# Patient Record
Sex: Male | Born: 1935 | Race: White | Hispanic: No | Smoking: Never smoker
Health system: Southern US, Community
[De-identification: ages and names within clinical notes are randomized; demographics above are authoritative.]

## PROBLEM LIST (undated history)

## (undated) DIAGNOSIS — R259 Unspecified abnormal involuntary movements: Secondary | ICD-10-CM

## (undated) DIAGNOSIS — R5381 Other malaise: Secondary | ICD-10-CM

## (undated) DIAGNOSIS — E739 Lactose intolerance, unspecified: Secondary | ICD-10-CM

## (undated) DIAGNOSIS — R42 Dizziness and giddiness: Secondary | ICD-10-CM

## (undated) DIAGNOSIS — K8020 Calculus of gallbladder without cholecystitis without obstruction: Secondary | ICD-10-CM

## (undated) DIAGNOSIS — F329 Major depressive disorder, single episode, unspecified: Secondary | ICD-10-CM

## (undated) DIAGNOSIS — H918X9 Other specified hearing loss, unspecified ear: Secondary | ICD-10-CM

## (undated) DIAGNOSIS — R252 Cramp and spasm: Secondary | ICD-10-CM

## (undated) DIAGNOSIS — F0280 Dementia in other diseases classified elsewhere without behavioral disturbance: Secondary | ICD-10-CM

## (undated) DIAGNOSIS — E785 Hyperlipidemia, unspecified: Secondary | ICD-10-CM

## (undated) DIAGNOSIS — R7302 Impaired glucose tolerance (oral): Secondary | ICD-10-CM

## (undated) DIAGNOSIS — R160 Hepatomegaly, not elsewhere classified: Secondary | ICD-10-CM

## (undated) DIAGNOSIS — J309 Allergic rhinitis, unspecified: Secondary | ICD-10-CM

## (undated) DIAGNOSIS — G309 Alzheimer's disease, unspecified: Secondary | ICD-10-CM

## (undated) DIAGNOSIS — F0390 Unspecified dementia without behavioral disturbance: Secondary | ICD-10-CM

## (undated) DIAGNOSIS — R0602 Shortness of breath: Secondary | ICD-10-CM

## (undated) DIAGNOSIS — M549 Dorsalgia, unspecified: Secondary | ICD-10-CM

## (undated) DIAGNOSIS — R5383 Other fatigue: Secondary | ICD-10-CM

## (undated) HISTORY — DX: Shortness of breath: R06.02

## (undated) HISTORY — DX: Lactose intolerance, unspecified: E73.9

## (undated) HISTORY — DX: Other malaise: R53.81

## (undated) HISTORY — DX: Dizziness and giddiness: R42

## (undated) HISTORY — DX: Dorsalgia, unspecified: M54.9

## (undated) HISTORY — DX: Calculus of gallbladder without cholecystitis without obstruction: K80.20

## (undated) HISTORY — DX: Hyperlipidemia, unspecified: E78.5

## (undated) HISTORY — DX: Other specified hearing loss, unspecified ear: H91.8X9

## (undated) HISTORY — DX: Hepatomegaly, not elsewhere classified: R16.0

## (undated) HISTORY — DX: Major depressive disorder, single episode, unspecified: F32.9

## (undated) HISTORY — DX: Other fatigue: R53.83

## (undated) HISTORY — DX: Cramp and spasm: R25.2

## (undated) HISTORY — PX: OTHER SURGICAL HISTORY: SHX169

## (undated) HISTORY — DX: Unspecified dementia without behavioral disturbance: F03.90

## (undated) HISTORY — DX: Allergic rhinitis, unspecified: J30.9

## (undated) HISTORY — DX: Impaired glucose tolerance (oral): R73.02

## (undated) HISTORY — DX: Unspecified abnormal involuntary movements: R25.9

---

## 2005-01-22 ENCOUNTER — Ambulatory Visit: Payer: Self-pay | Admitting: Internal Medicine

## 2006-05-27 ENCOUNTER — Ambulatory Visit: Payer: Self-pay | Admitting: Internal Medicine

## 2006-05-27 LAB — CONVERTED CEMR LAB
Alkaline Phosphatase: 47 units/L (ref 39–117)
BUN: 16 mg/dL (ref 6–23)
Basophils Absolute: 0 10*3/uL (ref 0.0–0.1)
Bilirubin, Direct: 0.1 mg/dL (ref 0.0–0.3)
CO2: 32 meq/L (ref 19–32)
Cholesterol: 199 mg/dL (ref 0–200)
Creatinine, Ser: 0.9 mg/dL (ref 0.4–1.5)
Eosinophils Absolute: 0.1 10*3/uL (ref 0.0–0.6)
Eosinophils Relative: 2.6 % (ref 0.0–5.0)
GFR calc Af Amer: 108 mL/min
Glucose, Bld: 110 mg/dL — ABNORMAL HIGH (ref 70–99)
HCT: 43.5 % (ref 39.0–52.0)
HDL: 55.1 mg/dL (ref >39.0)
Hemoglobin: 15.1 g/dL (ref 13.0–17.0)
LDL Cholesterol: 130 mg/dL — ABNORMAL HIGH (ref 0–99)
Lymphocytes Relative: 23.6 % (ref 12.0–46.0)
MCHC: 34.8 g/dL (ref 30.0–36.0)
MCV: 95.5 fL (ref 78.0–100.0)
Monocytes Absolute: 0.4 10*3/uL (ref 0.2–0.7)
Neutro Abs: 2.4 10*3/uL (ref 1.4–7.7)
Neutrophils Relative %: 62 % (ref 43.0–77.0)
Nitrite: NEGATIVE
PSA: 1.33 ng/mL (ref 0.10–4.00)
Potassium: 4 meq/L (ref 3.5–5.1)
Sodium: 142 meq/L (ref 135–145)
Total Bilirubin: 1 mg/dL (ref 0.3–1.2)
Total CHOL/HDL Ratio: 3.6
Total Protein: 7.1 g/dL (ref 6.0–8.3)
Urobilinogen, UA: 0.2 (ref 0.0–1.0)
WBC: 3.8 10*3/uL — ABNORMAL LOW (ref 4.5–10.5)

## 2006-06-12 ENCOUNTER — Ambulatory Visit: Payer: Self-pay | Admitting: Gastroenterology

## 2006-06-26 ENCOUNTER — Ambulatory Visit: Payer: Self-pay | Admitting: Gastroenterology

## 2007-02-05 ENCOUNTER — Ambulatory Visit: Payer: Self-pay | Admitting: Internal Medicine

## 2007-02-05 DIAGNOSIS — M549 Dorsalgia, unspecified: Secondary | ICD-10-CM

## 2007-02-05 DIAGNOSIS — E785 Hyperlipidemia, unspecified: Secondary | ICD-10-CM | POA: Insufficient documentation

## 2007-02-05 HISTORY — DX: Dorsalgia, unspecified: M54.9

## 2007-02-05 HISTORY — DX: Hyperlipidemia, unspecified: E78.5

## 2008-06-16 ENCOUNTER — Ambulatory Visit: Payer: Self-pay | Admitting: Internal Medicine

## 2008-06-16 DIAGNOSIS — R259 Unspecified abnormal involuntary movements: Secondary | ICD-10-CM

## 2008-06-16 DIAGNOSIS — R252 Cramp and spasm: Secondary | ICD-10-CM

## 2008-06-16 HISTORY — DX: Cramp and spasm: R25.2

## 2008-06-16 HISTORY — DX: Unspecified abnormal involuntary movements: R25.9

## 2008-06-17 LAB — CONVERTED CEMR LAB
BUN: 8 mg/dL (ref 6–23)
Basophils Absolute: 0 10*3/uL (ref 0.0–0.1)
Bilirubin Urine: NEGATIVE
Cholesterol: 206 mg/dL — ABNORMAL HIGH (ref 0–200)
Creatinine, Ser: 1 mg/dL (ref 0.4–1.5)
Direct LDL: 122.1 mg/dL
GFR calc non Af Amer: 78.09 mL/min (ref >60)
Glucose, Bld: 104 mg/dL — ABNORMAL HIGH (ref 70–99)
HCT: 39.5 % (ref 39.0–52.0)
Hemoglobin, Urine: NEGATIVE
Ketones, ur: NEGATIVE mg/dL
Lymphs Abs: 1 10*3/uL (ref 0.7–4.0)
MCV: 95.4 fL (ref 78.0–100.0)
Monocytes Absolute: 0.4 10*3/uL (ref 0.1–1.0)
Monocytes Relative: 12.4 % — ABNORMAL HIGH (ref 3.0–12.0)
Neutrophils Relative %: 56.6 % (ref 43.0–77.0)
Platelets: 200 10*3/uL (ref 150.0–400.0)
Potassium: 4.7 meq/L (ref 3.5–5.1)
RDW: 11.3 % — ABNORMAL LOW (ref 11.5–14.6)
Total Bilirubin: 0.9 mg/dL (ref 0.3–1.2)
Total CHOL/HDL Ratio: 3
Total Protein, Urine: NEGATIVE mg/dL
Triglycerides: 79 mg/dL (ref 0.0–149.0)
Urine Glucose: NEGATIVE mg/dL
VLDL: 15.8 mg/dL (ref 0.0–40.0)

## 2008-10-29 ENCOUNTER — Encounter: Payer: Self-pay | Admitting: Internal Medicine

## 2008-11-02 ENCOUNTER — Encounter: Payer: Self-pay | Admitting: Internal Medicine

## 2008-11-02 ENCOUNTER — Ambulatory Visit: Payer: Self-pay | Admitting: Internal Medicine

## 2008-11-02 DIAGNOSIS — R5381 Other malaise: Secondary | ICD-10-CM | POA: Insufficient documentation

## 2008-11-02 DIAGNOSIS — E739 Lactose intolerance, unspecified: Secondary | ICD-10-CM

## 2008-11-02 DIAGNOSIS — R5383 Other fatigue: Secondary | ICD-10-CM

## 2008-11-02 DIAGNOSIS — R0602 Shortness of breath: Secondary | ICD-10-CM

## 2008-11-02 HISTORY — DX: Shortness of breath: R06.02

## 2008-11-02 HISTORY — DX: Lactose intolerance, unspecified: E73.9

## 2008-11-02 HISTORY — DX: Other malaise: R53.81

## 2008-11-03 LAB — CONVERTED CEMR LAB
ALT: 16 units/L (ref 0–53)
BUN: 9 mg/dL (ref 6–23)
Basophils Absolute: 0 10*3/uL (ref 0.0–0.1)
Basophils Relative: 0.3 % (ref 0.0–3.0)
Bilirubin, Direct: 0 mg/dL (ref 0.0–0.3)
CO2: 33 meq/L — ABNORMAL HIGH (ref 19–32)
Chloride: 102 meq/L (ref 96–112)
Creatinine, Ser: 1 mg/dL (ref 0.4–1.5)
Eosinophils Absolute: 0 10*3/uL (ref 0.0–0.7)
Hemoglobin, Urine: NEGATIVE
Leukocytes, UA: NEGATIVE
Lymphocytes Relative: 22.7 % (ref 12.0–46.0)
MCHC: 34.2 g/dL (ref 30.0–36.0)
Neutrophils Relative %: 64.4 % (ref 43.0–77.0)
Nitrite: NEGATIVE
RBC: 4.33 M/uL (ref 4.22–5.81)
TSH: 1.39 microintl units/mL (ref 0.35–5.50)
Total Protein, Urine: NEGATIVE mg/dL
Total Protein: 7.1 g/dL (ref 6.0–8.3)
Urobilinogen, UA: 0.2 (ref 0.0–1.0)

## 2008-11-09 ENCOUNTER — Ambulatory Visit: Payer: Self-pay | Admitting: Internal Medicine

## 2008-11-09 DIAGNOSIS — R160 Hepatomegaly, not elsewhere classified: Secondary | ICD-10-CM

## 2008-11-09 DIAGNOSIS — R42 Dizziness and giddiness: Secondary | ICD-10-CM

## 2008-11-09 HISTORY — DX: Dizziness and giddiness: R42

## 2008-11-09 HISTORY — DX: Hepatomegaly, not elsewhere classified: R16.0

## 2008-11-16 ENCOUNTER — Encounter: Admission: RE | Admit: 2008-11-16 | Discharge: 2008-11-16 | Payer: Self-pay | Admitting: Internal Medicine

## 2008-11-17 ENCOUNTER — Ambulatory Visit: Payer: Self-pay | Admitting: Internal Medicine

## 2008-11-17 ENCOUNTER — Encounter: Payer: Self-pay | Admitting: Internal Medicine

## 2008-11-18 ENCOUNTER — Ambulatory Visit: Payer: Self-pay | Admitting: Internal Medicine

## 2008-11-18 ENCOUNTER — Encounter: Payer: Self-pay | Admitting: Internal Medicine

## 2008-11-18 ENCOUNTER — Ambulatory Visit: Payer: Self-pay

## 2009-06-07 ENCOUNTER — Ambulatory Visit: Payer: Self-pay | Admitting: Internal Medicine

## 2009-06-07 DIAGNOSIS — J309 Allergic rhinitis, unspecified: Secondary | ICD-10-CM | POA: Insufficient documentation

## 2009-06-07 DIAGNOSIS — K8020 Calculus of gallbladder without cholecystitis without obstruction: Secondary | ICD-10-CM | POA: Insufficient documentation

## 2009-06-07 DIAGNOSIS — H918X9 Other specified hearing loss, unspecified ear: Secondary | ICD-10-CM

## 2009-06-07 HISTORY — DX: Calculus of gallbladder without cholecystitis without obstruction: K80.20

## 2009-06-07 HISTORY — DX: Allergic rhinitis, unspecified: J30.9

## 2009-06-07 HISTORY — DX: Other specified hearing loss, unspecified ear: H91.8X9

## 2009-09-01 ENCOUNTER — Encounter: Payer: Self-pay | Admitting: Internal Medicine

## 2009-09-13 ENCOUNTER — Encounter: Payer: Self-pay | Admitting: Internal Medicine

## 2010-02-16 ENCOUNTER — Encounter: Payer: Self-pay | Admitting: Internal Medicine

## 2010-03-30 NOTE — Letter (Signed)
Summary: [facility]  [facility]   Imported By: Sherian Rein 09/30/2009 09:27:39  _____________________________________________________________________  External Attachment:    Type:   Image     Comment:   External Document

## 2010-03-30 NOTE — Letter (Signed)
Summary: [facility]  [facility]   Imported By: Sherian Rein 02/23/2010 15:59:54  _____________________________________________________________________  External Attachment:    Type:   Image     Comment:   External Document

## 2010-03-30 NOTE — Letter (Signed)
Summary: [facility]  [facility]   Imported By: Sherian Rein 09/30/2009 09:28:52  _____________________________________________________________________  External Attachment:    Type:   Image     Comment:   External Document

## 2010-03-30 NOTE — Assessment & Plan Note (Signed)
Summary: daughter made appt,needs f/u appt/#/cd   Vital Signs:  Patient profile:   76 year old male Height:      69.5 inches Weight:      151.25 pounds BMI:     22.10 O2 Sat:      97 % on Room air Temp:     98.2 degrees F oral Pulse rate:   67 / minute BP sitting:   114 / 60  (left arm) Cuff size:   regular  Vitals Entered ByZella Ball Ewing (June 07, 2009 11:14 AM)  O2 Flow:  Room air  Preventive Care Screening  Last Flu Shot:    Date:  11/26/2008    Results:  given   CC: followup/RE   Primary Care Provider:  Dr. Oliver Barre  CC:  followup/RE.  History of Present Illness: here with family, overall doing well , without specific complaint, except are unhappy with what they described as unhelpful and abrupt behavior by Neurology at last exams.  Has been taking the primidone for several years, and has dementia, but at most recent exam , he was suggested to have probable parkinson's, for which it was recommended to change the primidone to sinamet 25/100 three times a day.  They were not comfortable with this, and wanted more explanation about the disease and the tx, but this did not occur.  They have not made the change as suggested, and in fact request a neurology referral to a different group, for second opinion.  No new complaints.  Pt denies CP, sob, doe, wheezing, orthopnea, pnd, worsening LE edema, palps, dizziness or syncope  Pt denies new neuro symptoms such as headache, facial or extremity weakness .  Does have mild to mod nasal allergy symptoms worse in the past 3 to 4 wks, with post nasal gtt, clear drainage, sneeze and itch, but no fever, pain or colored d/c.    Problems Prior to Update: 1)  Other Specified Forms of Hearing Loss  (ICD-389.8) 2)  Allergic Rhinitis  (ICD-477.9) 3)  Tremor  (ICD-781.0) 4)  Cholelithiasis  (ICD-574.20) 5)  Hepatomegaly  (ICD-789.1) 6)  Dizziness  (ICD-780.4) 7)  Dyspnea  (ICD-786.05) 8)  Glucose Intolerance  (ICD-271.3) 9)  Dyspnea   (ICD-786.05) 10)  Fatigue  (ICD-780.79) 11)  Preventive Health Care  (ICD-V70.0) 12)  Leg Cramps, Nocturnal  (ICD-729.82) 13)  Tremor  (ICD-781.0) 14)  Back Pain  (ICD-724.5) 15)  Hyperlipidemia  (ICD-272.4) 16)  Family History of Alcoholism/addiction  (ICD-V61.41)  Medications Prior to Update: 1)  Multivitamins   Tabs (Multiple Vitamin) .... Take 1 By Mouth Qd 2)  Adult Aspirin Ec Low Strength 81 Mg  Tbec (Aspirin) .... Take 1 By Mouth Qd 3)  Aricept 10 Mg Tabs (Donepezil Hcl) .Marland Kitchen.. 1po Once Daily 4)  Primidone 50 Mg Tabs (Primidone) .Marland Kitchen.. 1po At Bedtime 5)  Namenda 5 Mg Tabs (Memantine Hcl) .Marland Kitchen.. 1 By Mouth Every Am and 2 By Mouth Every Pm 6)  Pepcid Ac Maximum Strength 20 Mg Tabs (Famotidine) .... One At Bedtime  Current Medications (verified): 1)  Multivitamins   Tabs (Multiple Vitamin) .... Take 1 By Mouth Qd 2)  Adult Aspirin Ec Low Strength 81 Mg  Tbec (Aspirin) .... Take 1 By Mouth Qd 3)  Aricept 10 Mg Tabs (Donepezil Hcl) .Marland Kitchen.. 1po Once Daily 4)  Primidone 50 Mg Tabs (Primidone) .Marland Kitchen.. 1po At Bedtime 5)  Namenda 10 Mg Tabs (Memantine Hcl) .Marland Kitchen.. 1po Two Times A Day 6)  Pepcid Ac Maximum Strength 20  Mg Tabs (Famotidine) .... One At Bedtime  Allergies (verified): No Known Drug Allergies  Past History:  Past Surgical History: Last updated: 02/05/2007 left and right hand surgury  Social History: Last updated: 11/18/2008 Never Smoked Alcohol use-no Widowed Lives alone Retired  Risk Factors: Smoking Status: never (02/05/2007)  Past Medical History: Hyperlipidemia tremor Unexplained sob    - - PFT's 11/17/08  FEV1 3.44 (120%) ratio 88, nl dlco    -  ECHO mild ar November 18, 2008  HEPATOMEGALY (ICD-789.1) DIZZINESS (ICD-780.4) DYSPNEA (ICD-786.05) GLUCOSE INTOLERANCE (ICD-271.3) DYSPNEA (ICD-786.05) FATIGUE (ICD-780.79) PREVENTIVE HEALTH CARE (ICD-V70.0) LEG CRAMPS, NOCTURNAL (ICD-729.82) TREMOR (ICD-781.0) BACK PAIN (ICD-724.5) HYPERLIPIDEMIA  (ICD-272.4) FAMILY HISTORY OF ALCOHOLISM/ADDICTION (ICD-V61.41) cholelithiasis  Allergic rhinitis  Review of Systems       all otherwise negative per pt -  also with some decreased hearing to right > left ears without pain , fever, dizziness, or ST or cough  Physical Exam  General:  alert and well-developed.   Head:  normocephalic and atraumatic.   Eyes:  vision grossly intact, pupils equal, and pupils round.   Ears:  R ear normal and L ear normal.  after wax irrigation for impaction Nose:  nasal dischargemucosal pallor and mucosal edema.   Mouth:  pharyngeal erythema and fair dentition.   Neck:  supple and no masses.   Lungs:  normal respiratory effort and normal breath sounds.   Heart:  normal rate and regular rhythm.   Extremities:  no edema, no erythema  Neurologic:  not done in detail today   Impression & Recommendations:  Problem # 1:  TREMOR (ICD-781.0)  ? parkinson - to refer to neurology [facility] for second opinion per family request, Continue all previous medications as before this visit   Orders: Neurology Referral (Neuro)  Problem # 2:  ALLERGIC RHINITIS (ICD-477.9) for allegra as needed , consider flonase if not improved  Problem # 3:  HYPERLIPIDEMIA (ICD-272.4)  Labs Reviewed: SGOT: 23 (11/02/2008)   SGPT: 16 (11/02/2008)   HDL:62.20 (06/16/2008), 55.1 (05/27/2006)  LDL:130 (05/27/2006)  Chol:206 (06/16/2008), 199 (05/27/2006)  Trig:79.0 (06/16/2008), 68 (05/27/2006) cont diet, declines further tx at this time, d/w pt and family  Problem # 4:  OTHER SPECIFIED FORMS OF HEARING LOSS (ICD-389.8) improved, s/p irrigation  Complete Medication List: 1)  Multivitamins Tabs (Multiple vitamin) .... Take 1 by mouth qd 2)  Adult Aspirin Ec Low Strength 81 Mg Tbec (Aspirin) .... Take 1 by mouth qd 3)  Aricept 10 Mg Tabs (Donepezil hcl) .Marland Kitchen.. 1po once daily 4)  Primidone 50 Mg Tabs (Primidone) .Marland Kitchen.. 1po at bedtime 5)  Namenda 10 Mg Tabs (Memantine hcl) .Marland Kitchen.. 1po  two times a day 6)  Pepcid Ac Maximum Strength 20 Mg Tabs (Famotidine) .... One at bedtime  Patient Instructions: 1)  Your ears were irrigated today 2)  Please take all new medications as prescribed - the generic allegra 3)  Continue all previous medications as before this visit  4)  You will be contacted about the referral(s) to: Neurology 5)  Please schedule a follow-up appointment in 6 months with CPX labs

## 2010-05-23 ENCOUNTER — Ambulatory Visit (INDEPENDENT_AMBULATORY_CARE_PROVIDER_SITE_OTHER): Payer: Medicare Other | Admitting: Internal Medicine

## 2010-05-23 ENCOUNTER — Encounter: Payer: Self-pay | Admitting: Internal Medicine

## 2010-05-23 VITALS — BP 120/62 | HR 61 | Temp 97.9°F | Ht 69.0 in | Wt 156.0 lb

## 2010-05-23 DIAGNOSIS — L989 Disorder of the skin and subcutaneous tissue, unspecified: Secondary | ICD-10-CM

## 2010-05-23 DIAGNOSIS — Z0000 Encounter for general adult medical examination without abnormal findings: Secondary | ICD-10-CM

## 2010-05-23 MED ORDER — SERTRALINE HCL 50 MG PO TABS: 50.0000 mg | ORAL_TABLET | Freq: Every day | ORAL | Status: DC

## 2010-05-23 NOTE — Progress Notes (Signed)
Subjective:    Patient ID: Titus Mould, male    DOB: 1934-09-29, 76 y.o.   MRN: [id number]  HPI Here to f/u with family - Here for wellness and f/u;  Overall doing ok;  Pt hx limited due to dementia but denies CP, worsening SOB, DOE, wheezing, orthopnea, PND, worsening LE edema, palpitations, dizziness or syncope.  Pt denies neurological change such as new Headache, facial or extremity weakness.  Pt denies polydipsia, polyuria, or low sugar symptoms. Pt states overall good compliance with treatment and medications, good tolerability, and trying to follow lower cholesterol diet.  Pt denies worsening depressive symptoms, suicidal ideation or panic. No fever, wt loss, night sweats, loss of appetite, or other constitutional symptoms.  Pt states good ability with ADL's, low fall risk, home safety reviewed and adequate, no significant changes in hearing or vision, and occasionally active with exercise.  Does have a skin lesion to the crown of the head for several months, gradually enlarging , not painful or ulcerating, not assoc with trauma. No hx of skin cancer.  Past Medical History  Diagnosis Date  . GLUCOSE INTOLERANCE 11/02/2008  . HYPERLIPIDEMIA 02/05/2007  . Other specified forms of hearing loss 06/07/2009  . ALLERGIC RHINITIS 06/07/2009  . CHOLELITHIASIS 06/07/2009  . BACK PAIN 02/05/2007  . LEG CRAMPS, NOCTURNAL 06/16/2008  . DIZZINESS 11/09/2008  . FATIGUE 11/02/2008  . TREMOR 06/16/2008  . DYSPNEA 11/02/2008  . Hepatomegaly 11/09/2008   Past Surgical History  Procedure Date  . Left and right hand surgury     reports that he has never smoked. He does not have any smokeless tobacco history on file. He reports that he does not drink alcohol. His drug history not on file. family history includes Alcohol abuse in his father; Cancer in his father; and Heart disease in his mother. No Known Allergies  Review of Systems Review of Systems  Constitutional: Negative for diaphoresis, activity change,  appetite change and unexpected weight change.  HENT: Negative for hearing loss, ear pain, facial swelling, mouth sores and neck stiffness.   Eyes: Negative for pain, redness and visual disturbance.  Respiratory: Negative for shortness of breath and wheezing.   Cardiovascular: Negative for chest pain and palpitations.  Gastrointestinal: Negative for diarrhea, blood in stool, abdominal distention and rectal pain.  Genitourinary: Negative for hematuria, flank pain and decreased urine volume.  Musculoskeletal: Negative for myalgias and joint swelling.  Skin: Negative for color change and wound.  Neurological: Negative for syncope and numbness.  Hematological: Negative for adenopathy.  Psychiatric/Behavioral: Negative for hallucinations, self-injury, decreased concentration and agitation.      Objective:   Physical Exam Physical Exam  VS noted Constitutional: Pt is oriented to person, place, and time. Appears well-developed and well-nourished.  HENT:  Head: Normocephalic and atraumatic.  Right Ear: External ear normal.  Left Ear: External ear normal.  Nose: Nose normal.  Mouth/Throat: Oropharynx is clear and moist.  Eyes: Conjunctivae and EOM are normal. Pupils are equal, round, and reactive to light.  Neck: Normal range of motion. Neck supple. No JVD present. No tracheal deviation present.  Cardiovascular: Normal rate, regular rhythm, normal heart sounds and intact distal pulses.   Pulmonary/Chest: Effort normal and breath sounds normal.  Abdominal: Soft. Bowel sounds are normal. There is no tenderness.  Musculoskeletal: Normal range of motion. Exhibits no edema.  Lymphadenopathy:  Has no cervical adenopathy.  Neurological: Pt is alert but disoriented to person, place, and time. Pt has normal reflexes. No cranial nerve deficit.  Skin: Skin is warm and dry. No rash noted. Has skin lesion to crown 10 mm, somewhat raised, erythem/tan, nonulcerated, somewhat crusted Psychiatric:  Has  normal  mood and affect. Behavior is normal.         Assessment & Plan:

## 2010-05-23 NOTE — Patient Instructions (Signed)
Continue all other medications as before You will be contacted regarding the referral for: dermatology Please call if you would like lab work done

## 2010-05-23 NOTE — Assessment & Plan Note (Signed)
Overall doing well, age appropriate education and counseling updated, referrals for preventative services and immunizations addressed, dietary and smoking counseling addressed, most recent labs and ECG reviewed.  I have personally reviewed and have noted: 1) the patient's medical and social history 2) The pt's use of alcohol, tobacco, and illicit drugs 3) The patient's current medications and supplements 4) Functional ability including ADL's, fall risk, home safety risk, hearing and visual impairment 5) Diet and physical activities 6) Evidence for depression or mood disorder 7) The patient's height, weight, and BMI have been recorded in the chart I have made referrals, and provided counseling and education based on review of the above Pt and family decline lab work today, may re-consider in the future

## 2010-05-23 NOTE — Assessment & Plan Note (Signed)
Somewhat suspicious for skin ca - for derm referral

## 2010-12-15 ENCOUNTER — Other Ambulatory Visit: Payer: Self-pay | Admitting: Internal Medicine

## 2010-12-15 DIAGNOSIS — M25559 Pain in unspecified hip: Secondary | ICD-10-CM

## 2010-12-15 DIAGNOSIS — M549 Dorsalgia, unspecified: Secondary | ICD-10-CM

## 2010-12-15 NOTE — Progress Notes (Signed)
Daughter in today - father fell down stairs, pt refuses ER or visit but will do xrays

## 2011-03-06 ENCOUNTER — Ambulatory Visit (INDEPENDENT_AMBULATORY_CARE_PROVIDER_SITE_OTHER): Payer: Medicare Other

## 2011-03-06 DIAGNOSIS — Z111 Encounter for screening for respiratory tuberculosis: Secondary | ICD-10-CM

## 2011-04-13 ENCOUNTER — Ambulatory Visit (INDEPENDENT_AMBULATORY_CARE_PROVIDER_SITE_OTHER): Payer: Medicare Other | Admitting: Internal Medicine

## 2011-04-13 ENCOUNTER — Encounter: Payer: Self-pay | Admitting: Internal Medicine

## 2011-04-13 VITALS — BP 108/78 | HR 64 | Temp 98.4°F | Resp 14 | Wt 155.8 lb

## 2011-04-13 DIAGNOSIS — R7309 Other abnormal glucose: Secondary | ICD-10-CM

## 2011-04-13 DIAGNOSIS — F0390 Unspecified dementia without behavioral disturbance: Secondary | ICD-10-CM

## 2011-04-13 DIAGNOSIS — F329 Major depressive disorder, single episode, unspecified: Secondary | ICD-10-CM

## 2011-04-13 DIAGNOSIS — R21 Rash and other nonspecific skin eruption: Secondary | ICD-10-CM

## 2011-04-13 DIAGNOSIS — R7302 Impaired glucose tolerance (oral): Secondary | ICD-10-CM

## 2011-04-13 DIAGNOSIS — F3289 Other specified depressive episodes: Secondary | ICD-10-CM

## 2011-04-13 MED ORDER — HYDROXYZINE HCL 50 MG PO TABS: 50.0000 mg | ORAL_TABLET | Freq: Four times a day (QID) | ORAL | Status: AC | PRN

## 2011-04-13 MED ORDER — PERMETHRIN 5 % EX CREA: TOPICAL_CREAM | Freq: Once | CUTANEOUS | Status: AC

## 2011-04-13 NOTE — Assessment & Plan Note (Signed)
?   Scabies  - for permethrin asd -   Scabies: Topical: Apply cream from head to toe; leave on for 8-14 hours before washing off with water; for infants, also apply on the hairline, neck, scalp, temple, and forehead; may reapply in 1 week if live mites appear. Permethrin 5% cream was shown to be safe and effective when applied to an infant <1 month of age with neonatal scabies; time of application was limited to 6 hours before rinsing with soap and wate

## 2011-04-13 NOTE — Patient Instructions (Addendum)
Take all new medications as prescribed - the cream You can also use the benadryl you have on your medication list for itching Continue all other medications as before Please return in 1 month, or sooner if needed Please call if not improved, for dermatology referral

## 2011-04-14 ENCOUNTER — Encounter: Payer: Self-pay | Admitting: Internal Medicine

## 2011-04-14 DIAGNOSIS — R7302 Impaired glucose tolerance (oral): Secondary | ICD-10-CM

## 2011-04-14 DIAGNOSIS — F32A Depression, unspecified: Secondary | ICD-10-CM

## 2011-04-14 DIAGNOSIS — F0390 Unspecified dementia without behavioral disturbance: Secondary | ICD-10-CM

## 2011-04-14 DIAGNOSIS — F329 Major depressive disorder, single episode, unspecified: Secondary | ICD-10-CM | POA: Insufficient documentation

## 2011-04-14 HISTORY — DX: Impaired glucose tolerance (oral): R73.02

## 2011-04-14 HISTORY — DX: Depression, unspecified: F32.A

## 2011-04-14 HISTORY — DX: Unspecified dementia, unspecified severity, without behavioral disturbance, psychotic disturbance, mood disturbance, and anxiety: F03.90

## 2011-04-14 NOTE — Assessment & Plan Note (Signed)
stable overall by hx and exam, and pt to continue medical treatment as before 

## 2011-04-14 NOTE — Assessment & Plan Note (Signed)
stable overall by hx and exam, most recent data reviewed with pt, and pt to continue medical treatment as before Lab Results  Component Value Date   HGBA1C 5.4 11/02/2008    

## 2011-04-14 NOTE — Assessment & Plan Note (Signed)
stable overall by hx and exam, most recent data reviewed with pt, and pt to continue medical treatment as before  Lab Results  Component Value Date   WBC 4.7 11/02/2008   HGB 14.3 11/02/2008   HCT 41.8 11/02/2008   PLT 195.0 11/02/2008   GLUCOSE 78 11/02/2008   CHOL 206* 06/16/2008   TRIG 79.0 06/16/2008   HDL 62.20 06/16/2008   LDLDIRECT 122.1 06/16/2008   LDLCALC 130* 05/27/2006   ALT 16 11/02/2008   AST 23 11/02/2008   NA 141 11/02/2008   K 4.6 11/02/2008   CL 102 11/02/2008   CREATININE 1.0 11/02/2008   BUN 9 11/02/2008   CO2 33* 11/02/2008   TSH 1.39 11/02/2008   PSA 0.92 06/16/2008   HGBA1C 5.4 11/02/2008

## 2011-04-14 NOTE — Progress Notes (Signed)
Subjective:    Patient ID: Phillip Gonzales, male    DOB: 04/27/1934, 77 y.o.   MRN: [id number]  HPI  Here to f/u with acute onset rash to back starting at the beltline and extneding up the back, some crusty, mild ithing;  Facility where he lives with mult cases of scabies.  No prior hx of same. Denies worsening depressive symptoms, suicidal ideation, or panic, though has ongoing anxiety, not increased recently.  Dementia overall stable symptomatically with gradual worsening at best, and not assoc with behavioral changes such as hallucinations, paranoia, or agitation.  Pt denies chest pain, increased sob or doe, wheezing, orthopnea, PND, increased LE swelling, palpitations, dizziness or syncope.  Pt denies new neurological symptoms such as new headache, or facial or extremity weakness or numbness   Pt denies polydipsia, polyuria.    Past Medical History  Diagnosis Date  . GLUCOSE INTOLERANCE 11/02/2008  . HYPERLIPIDEMIA 02/05/2007  . Other specified forms of hearing loss 06/07/2009  . ALLERGIC RHINITIS 06/07/2009  . CHOLELITHIASIS 06/07/2009  . BACK PAIN 02/05/2007  . LEG CRAMPS, NOCTURNAL 06/16/2008  . DIZZINESS 11/09/2008  . FATIGUE 11/02/2008  . TREMOR 06/16/2008  . DYSPNEA 11/02/2008  . Hepatomegaly 11/09/2008  . Impaired glucose tolerance 04/14/2011   Past Surgical History  Procedure Date  . Left and right hand surgury     reports that he has never smoked. He does not have any smokeless tobacco history on file. He reports that he does not drink alcohol. His drug history not on file. family history includes Alcohol abuse in his father; Cancer in his father; and Heart disease in his mother. No Known Allergies Current Outpatient Prescriptions on File Prior to Visit  Medication Sig Dispense Refill  . aspirin 81 MG EC tablet Take 81 mg by mouth daily.        . diphenhydrAMINE (BENADRYL) 25 MG tablet Take 25 mg by mouth daily.        Marland Kitchen donepezil (ARICEPT) 10 MG tablet Take 10 mg by mouth daily.         . famotidine (PEPCID AC MAXIMUM STRENGTH) 20 MG tablet Take 20 mg by mouth 2 (two) times daily as needed. At bedtime       . memantine (NAMENDA) 10 MG tablet Take 10 mg by mouth 2 (two) times daily.        . Multiple Vitamin (MULTIVITAMIN) tablet Take 1 tablet by mouth daily.        . primidone (MYSOLINE) 50 MG tablet Take 50 mg by mouth at bedtime.        . sertraline (ZOLOFT) 50 MG tablet Take 1 tablet (50 mg total) by mouth daily.  30 tablet  2   Review of Systems Review of Systems  Constitutional: Negative for diaphoresis and unexpected weight change.  HENT: Negative for drooling and tinnitus.   Eyes: Negative for photophobia and visual disturbance.  Respiratory: Negative for choking and stridor.   Gastrointestinal: Negative for vomiting and blood in stool.  Genitourinary: Negative for hematuria and decreased urine volume.     Objective:   Physical Exam BP 108/78  Pulse 64  Temp(Src) 98.4 F (36.9 C) (Oral)  Resp 14  Wt 155 lb 12 oz (70.648 kg)  SpO2 98% Physical Exam  VS noted Constitutional: Pt appears well-developed and well-nourished.  HENT: Head: Normocephalic.  Right Ear: External ear normal.  Left Ear: External ear normal.  Eyes: Conjunctivae and EOM are normal. Pupils are equal, round, and reactive to light.  Neck: Normal range of motion. Neck supple.  Cardiovascular: Normal rate and regular rhythm.   Pulmonary/Chest: Effort normal and breath sounds normal.  Neurological: Pt is alert. No cranial nerve deficit.  Skin: Skin is warm. No erythema. except for diffuse rash worse at beltline but extending bilat to the t5 level as well Psychiatric: Pt behavior is normal. Thought content normal. not depressed affect or nervous    Assessment & Plan:

## 2011-05-15 ENCOUNTER — Ambulatory Visit: Payer: Medicare Other | Admitting: Internal Medicine

## 2011-05-31 ENCOUNTER — Ambulatory Visit (INDEPENDENT_AMBULATORY_CARE_PROVIDER_SITE_OTHER): Payer: Medicare Other | Admitting: Internal Medicine

## 2011-05-31 ENCOUNTER — Encounter: Payer: Self-pay | Admitting: Internal Medicine

## 2011-05-31 VITALS — BP 110/62 | HR 56 | Temp 97.6°F | Ht 65.0 in | Wt 154.5 lb

## 2011-05-31 DIAGNOSIS — R7309 Other abnormal glucose: Secondary | ICD-10-CM

## 2011-05-31 DIAGNOSIS — L989 Disorder of the skin and subcutaneous tissue, unspecified: Secondary | ICD-10-CM | POA: Insufficient documentation

## 2011-05-31 DIAGNOSIS — H1010 Acute atopic conjunctivitis, unspecified eye: Secondary | ICD-10-CM

## 2011-05-31 DIAGNOSIS — R5383 Other fatigue: Secondary | ICD-10-CM

## 2011-05-31 DIAGNOSIS — H1045 Other chronic allergic conjunctivitis: Secondary | ICD-10-CM

## 2011-05-31 DIAGNOSIS — E785 Hyperlipidemia, unspecified: Secondary | ICD-10-CM

## 2011-05-31 DIAGNOSIS — R5381 Other malaise: Secondary | ICD-10-CM

## 2011-05-31 DIAGNOSIS — R7302 Impaired glucose tolerance (oral): Secondary | ICD-10-CM

## 2011-05-31 MED ORDER — KETOTIFEN FUMARATE 0.025 % OP SOLN: 1.0000 [drp] | Freq: Two times a day (BID) | OPHTHALMIC | Status: AC

## 2011-05-31 NOTE — Patient Instructions (Addendum)
Your most recent rash to the back has resolved You will be contacted regarding the referral for: dermatology for the new skin spot to the left neck near the ear Take all new medications as prescribed - the Zaditor (prescription given, but I think is OTC) Continue all other medications as before Please go to LAB in the Basement for the blood and/or urine tests to be done today You will be contacted by phone if any changes need to be made immediately.  Otherwise, you will receive a letter about your results with an explanation. Please return in 6 months, or sooner if  needed

## 2011-05-31 NOTE — Assessment & Plan Note (Signed)
To left neck - for derm referral, s/p recent skin cancer removed from scalp jan 2013 - daughter asks for same dermatologist

## 2011-06-03 ENCOUNTER — Encounter: Payer: Self-pay | Admitting: Internal Medicine

## 2011-06-03 NOTE — Progress Notes (Signed)
Subjective:    Patient ID: Phillip Gonzales, male    DOB: Mar 13, 1934, 77 y.o.   MRN: [id number]  HPI  Here to f/u; Pt not helpful with hx., but here with wife this visit,  Rash from last visit resolved.  Did have recent skin tear, now scabbed with bandiad to post left hand, no evidence cellullitis today.  Does have eye itching apparently , worse right than left and some clearish d/c as well.   Pt denies fever, wt loss, night sweats, loss of appetite, or other constitutional symptoms .  Pt unable to give other signficant hx due to dementia, but wife states Pt denies chest pain, increased sob or doe, wheezing, orthopnea, PND, increased LE swelling, palpitations, dizziness or syncope. Pt denies new neurological symptoms such as new headache, or facial or extremity weakness or numbness . Does seem to have ongoing fatigue per wife, o/w nonspecific.  No overt bleeding or bruising.  Past Medical History  Diagnosis Date  . GLUCOSE INTOLERANCE 11/02/2008  . HYPERLIPIDEMIA 02/05/2007  . Other specified forms of hearing loss 06/07/2009  . ALLERGIC RHINITIS 06/07/2009  . CHOLELITHIASIS 06/07/2009  . BACK PAIN 02/05/2007  . LEG CRAMPS, NOCTURNAL 06/16/2008  . DIZZINESS 11/09/2008  . FATIGUE 11/02/2008  . TREMOR 06/16/2008  . DYSPNEA 11/02/2008  . Hepatomegaly 11/09/2008  . Impaired glucose tolerance 04/14/2011  . Dementia 04/14/2011  . Depression 04/14/2011   Past Surgical History  Procedure Date  . Left and right hand surgury     reports that he has never smoked. He does not have any smokeless tobacco history on file. He reports that he does not drink alcohol or use illicit drugs. family history includes Alcohol abuse in his father; Cancer in his father; and Heart disease in his mother. No Known Allergies Current Outpatient Prescriptions on File Prior to Visit  Medication Sig Dispense Refill  . aspirin 81 MG EC tablet Take 81 mg by mouth daily.        . diphenhydrAMINE (BENADRYL) 25 MG tablet Take 25 mg by  mouth daily.        Marland Kitchen donepezil (ARICEPT) 10 MG tablet Take 10 mg by mouth daily.        . famotidine (PEPCID AC MAXIMUM STRENGTH) 20 MG tablet Take 20 mg by mouth 2 (two) times daily as needed. At bedtime       . memantine (NAMENDA) 10 MG tablet Take 10 mg by mouth 2 (two) times daily.        . Multiple Vitamin (MULTIVITAMIN) tablet Take 1 tablet by mouth daily.        . primidone (MYSOLINE) 50 MG tablet Take 50 mg by mouth at bedtime.        . sertraline (ZOLOFT) 50 MG tablet Take 1 tablet (50 mg total) by mouth daily.  30 tablet  2   Review of Systems Unable due to dementia    Objective:   Physical Exam BP 110/62  Pulse 56  Temp(Src) 97.6 F (36.4 C) (Oral)  Ht 5\' 5"  (1.651 m)  Wt 154 lb 8 oz (70.081 kg)  BMI 25.71 kg/m2  SpO2 92% Physical Exam  VS noted Constitutional: Pt appears well-developed and well-nourished.  HENT: Head: Normocephalic.  Right Ear: External ear normal.  Left Ear: External ear normal.  Eyes: Bilat Conjunctivae mild erythema, no d/c and EOM are normal. Pupils are equal, round, and reactive to light.  Neck: Normal range of motion. Neck supple.  Cardiovascular: Normal rate and regular rhythm.  Pulmonary/Chest: Effort normal and breath sounds normal.  Abd:  Soft, NT, non-distended, + BS Neurological: Pt is alert. No cranial nerve deficit.  Skin: Skin is warm. No erythema. No rash, Small scab noted left hand, nontender, no cellultiis Skin lesion dark to left neck increased in size per wife Psychiatric: Pt behavior is normal. Thought content c/w severe dementia    Assessment & Plan:

## 2011-06-03 NOTE — Assessment & Plan Note (Signed)
Asympt, for a1c today,  to f/u any worsening symptoms or concerns 

## 2011-06-03 NOTE — Assessment & Plan Note (Signed)
Etiology unclear, Exam otherwise benign, to check labs as documented, follow with expectant management  

## 2011-06-03 NOTE — Assessment & Plan Note (Signed)
most recent data reviewed with pt, with mild elev LDL prior, and pt to continue medical treatment as before but consider statin for LDL > 100 if not able with diet  Lab Results  Component Value Date   LDLCALC 130* 05/27/2006

## 2011-06-03 NOTE — Assessment & Plan Note (Signed)
Ok for otc zaditor prn,  to f/u any worsening symptoms or concerns  

## 2011-11-01 ENCOUNTER — Ambulatory Visit (INDEPENDENT_AMBULATORY_CARE_PROVIDER_SITE_OTHER): Payer: Medicare Other | Admitting: Internal Medicine

## 2011-11-01 ENCOUNTER — Ambulatory Visit (INDEPENDENT_AMBULATORY_CARE_PROVIDER_SITE_OTHER)
Admission: RE | Admit: 2011-11-01 | Discharge: 2011-11-01 | Disposition: A | Discharge status: Home / Self Care | Payer: Medicare Other | Source: Ambulatory Visit | Attending: Internal Medicine | Admitting: Internal Medicine

## 2011-11-01 ENCOUNTER — Encounter: Payer: Self-pay | Admitting: Internal Medicine

## 2011-11-01 VITALS — BP 122/64 | HR 74 | Temp 97.5°F | Resp 16 | Wt 155.8 lb

## 2011-11-01 DIAGNOSIS — M79609 Pain in unspecified limb: Secondary | ICD-10-CM

## 2011-11-01 DIAGNOSIS — M79642 Pain in left hand: Secondary | ICD-10-CM | POA: Insufficient documentation

## 2011-11-01 DIAGNOSIS — Z23 Encounter for immunization: Secondary | ICD-10-CM

## 2011-11-01 DIAGNOSIS — M19049 Primary osteoarthritis, unspecified hand: Secondary | ICD-10-CM

## 2011-11-01 MED ORDER — DICLOFENAC SODIUM 1.5 % TD SOLN: 10.0000 [drp] | Freq: Three times a day (TID) | TRANSDERMAL | Status: DC

## 2011-11-01 NOTE — Addendum Note (Signed)
Addended by: Rock Nephew T on: 11/01/2011 02:51 PM   Modules accepted: Orders

## 2011-11-01 NOTE — Patient Instructions (Signed)
Osteoarthritis Osteoarthritis is the most common form of arthritis. It is redness, soreness, and swelling (inflammation) affecting the cartilage. Cartilage acts as a cushion, covering the ends of bones where they meet to form a joint. CAUSES  Over time, the cartilage begins to wear away. This causes bone to rub on bone. This produces pain and stiffness in the affected joints. Factors that contribute to this problem are:  Excessive body weight.   Age.   Overuse of joints.  SYMPTOMS   People with osteoarthritis usually experience joint pain, swelling, or stiffness.   Over time, the joint may lose its normal shape.   Small deposits of bone (osteophytes) may grow on the edges of the joint.   Bits of bone or cartilage can break off and float inside the joint space. This may cause more pain and damage.   Osteoarthritis can lead to depression, anxiety, feelings of helplessness, and limitations on daily activities.  The most commonly affected joints are in the:  Ends of the fingers.   Thumbs.   Neck.   Lower back.   Knees.   Hips.  DIAGNOSIS  Diagnosis is mostly based on your symptoms and exam. Tests may be helpful, including:  X-rays of the affected joint.   A computerized magnetic scan (MRI).   Blood tests to rule out other types of arthritis.   Joint fluid tests. This involves using a needle to draw fluid from the joint and examining the fluid under a microscope.  TREATMENT  Goals of treatment are to control pain, improve joint function, maintain a normal body weight, and maintain a healthy lifestyle. Treatment approaches may include:  A prescribed exercise program with rest and joint relief.   Weight control with nutritional education.   Pain relief techniques such as:   Properly applied heat and cold.   Electric pulses delivered to nerve endings under the skin (transcutaneous electrical nerve stimulation, TENS).   Massage.   Certain supplements. Ask your  caregiver before using any supplements, especially in combination with prescribed drugs.   Medicines to control pain, such as:   Acetaminophen.   Nonsteroidal anti-inflammatory drugs (NSAIDs), such as naproxen.   Narcotic or central-acting agents, such as tramadol. This drug carries a risk of addiction and is generally prescribed for short-term use.   Corticosteroids. These can be given orally or as injection. This is a short-term treatment, not recommended for routine use.   Surgery to reposition the bones and relieve pain (osteotomy) or to remove loose pieces of bone and cartilage. Joint replacement may be needed in advanced states of osteoarthritis.  HOME CARE INSTRUCTIONS  Your caregiver can recommend specific types of exercise. These may include:  Strengthening exercises. These are done to strengthen the muscles that support joints affected by arthritis. They can be performed with weights or with exercise bands to add resistance.   Aerobic activities. These are exercises, such as brisk walking or low-impact aerobics, that get your heart pumping. They can help keep your lungs and circulatory system in shape.   Range-of-motion activities. These keep your joints limber.   Balance and agility exercises. These help you maintain daily living skills.  Learning about your condition and being actively involved in your care will help improve the course of your osteoarthritis. SEEK MEDICAL CARE IF:   You feel hot or your skin turns red.   You develop a rash in addition to your joint pain.   You have an oral temperature above 102 F (38.9 C).  FOR   MORE INFORMATION  National Institute of Arthritis and Musculoskeletal and Skin Diseases: www.niams.nih.gov National Institute on Aging: www.nia.nih.gov American College of Rheumatology: www.rheumatology.org Document Released: 02/12/2005 Document Revised: 02/01/2011 Document Reviewed: 05/26/2009 [employer] Patient Information 2012 [employer]. 

## 2011-11-01 NOTE — Progress Notes (Signed)
  Subjective:    Patient ID: Phillip Gonzales, male    DOB: 06-02-1934, 77 y.o.   MRN: [id number]  Arthritis Presents for initial visit. The disease course has been worsening. The condition has lasted for 1 year. He complains of pain. He reports no stiffness, joint swelling or joint warmth. Affected locations include the left MCP and left PIP. His pain is at a severity of 2/10. Pertinent negatives include no diarrhea, fatigue, pain at night, pain while resting, rash or weight loss. His past medical history is significant for osteoarthritis. His pertinent risk factors include overuse. Past treatments include nothing.      Review of Systems  Constitutional: Negative.  Negative for weight loss and fatigue.  HENT: Negative.   Eyes: Negative.   Respiratory: Negative.   Cardiovascular: Negative.   Gastrointestinal: Negative.  Negative for diarrhea.  Genitourinary: Negative.   Musculoskeletal: Positive for arthralgias and arthritis. Negative for myalgias, back pain, joint swelling, gait problem and stiffness.  Skin: Negative.  Negative for rash.  Neurological: Positive for tremors.  Hematological: Negative.   Psychiatric/Behavioral: Negative.        Objective:   Physical Exam  Musculoskeletal:       Left hand: He exhibits bony tenderness (MCP and IP joints are hypertrophied c/w OA). He exhibits normal range of motion, no tenderness, normal capillary refill, no deformity, no laceration and no swelling. normal sensation noted. Normal strength noted.      Lab Results  Component Value Date   WBC 4.7 11/02/2008   HGB 14.3 11/02/2008   HCT 41.8 11/02/2008   PLT 195.0 11/02/2008   GLUCOSE 78 11/02/2008   CHOL 206* 06/16/2008   TRIG 79.0 06/16/2008   HDL 62.20 06/16/2008   LDLDIRECT 122.1 06/16/2008   LDLCALC 130* 05/27/2006   ALT 16 11/02/2008   AST 23 11/02/2008   NA 141 11/02/2008   K 4.6 11/02/2008   CL 102 11/02/2008   CREATININE 1.0 11/02/2008   BUN 9 11/02/2008   CO2 33* 11/02/2008   TSH 1.39 11/02/2008     PSA 0.92 06/16/2008   HGBA1C 5.4 11/02/2008      Assessment & Plan:

## 2011-11-01 NOTE — Assessment & Plan Note (Signed)
He will start using Pennsaid for symptom relief

## 2011-11-01 NOTE — Assessment & Plan Note (Signed)
I will check a plain film of the left hand to look for spurs, djd, joint destruction, etc.

## 2011-12-13 ENCOUNTER — Ambulatory Visit (INDEPENDENT_AMBULATORY_CARE_PROVIDER_SITE_OTHER): Payer: Medicare Other | Admitting: Internal Medicine

## 2011-12-13 ENCOUNTER — Encounter: Payer: Self-pay | Admitting: Internal Medicine

## 2011-12-13 VITALS — BP 112/70 | HR 75 | Temp 97.2°F | Ht 69.0 in | Wt 156.2 lb

## 2011-12-13 DIAGNOSIS — Z0000 Encounter for general adult medical examination without abnormal findings: Secondary | ICD-10-CM

## 2011-12-13 DIAGNOSIS — R7309 Other abnormal glucose: Secondary | ICD-10-CM

## 2011-12-13 DIAGNOSIS — R7302 Impaired glucose tolerance (oral): Secondary | ICD-10-CM

## 2011-12-13 NOTE — Progress Notes (Signed)
Subjective:    Patient ID: Phillip Gonzales, male    DOB: 1934-05-30, 77 y.o.   MRN: [id number]  HPI  Here for wellness and f/u with several family members including his daughter POA;  Overall doing ok;  Pt with dementia but denies CP, worsening SOB, DOE, wheezing, orthopnea, PND, worsening LE edema, palpitations, dizziness or syncope.  Pt denies neurological change such as new Headache, facial or extremity weakness.  Pt denies polydipsia, polyuria, or low sugar symptoms. Pt states overall good compliance with treatment and medications, good tolerability, and trying to follow lower cholesterol diet.  Pt denies worsening depressive symptoms, suicidal ideation or panic. No fever, wt loss, night sweats, loss of appetite, or other constitutional symptoms.  Pt states good ability with ADL's, low to mod fall risk, lives at senior living - safety reviewed and adequate, no significant changes in hearing or vision except for right hearing loss for about 1 wk, and not very active with exercise due to dementia and and tremor. Past Medical History  Diagnosis Date  . GLUCOSE INTOLERANCE 11/02/2008  . HYPERLIPIDEMIA 02/05/2007  . Other specified forms of hearing loss 06/07/2009  . ALLERGIC RHINITIS 06/07/2009  . CHOLELITHIASIS 06/07/2009  . BACK PAIN 02/05/2007  . LEG CRAMPS, NOCTURNAL 06/16/2008  . DIZZINESS 11/09/2008  . FATIGUE 11/02/2008  . TREMOR 06/16/2008  . DYSPNEA 11/02/2008  . Hepatomegaly 11/09/2008  . Impaired glucose tolerance 04/14/2011  . Dementia 04/14/2011  . Depression 04/14/2011   Past Surgical History  Procedure Date  . Left and right hand surgury     reports that he has never smoked. He does not have any smokeless tobacco history on file. He reports that he does not drink alcohol or use illicit drugs. family history includes Alcohol abuse in his father; Cancer in his father; and Heart disease in his mother. No Known Allergies Current Outpatient Prescriptions on File Prior to Visit  Medication  Sig Dispense Refill  . aspirin 81 MG EC tablet Take 81 mg by mouth daily.        . Diclofenac Sodium (PENNSAID) 1.5 % SOLN Place 0.5 mLs onto the skin 3 (three) times daily. Rub onto painful joints of left hand and left fingers  150 mL  11  . diphenhydrAMINE (BENADRYL) 25 MG tablet Take 25 mg by mouth daily.        Marland Kitchen donepezil (ARICEPT) 10 MG tablet Take 10 mg by mouth daily.        . famotidine (PEPCID AC MAXIMUM STRENGTH) 20 MG tablet Take 20 mg by mouth 2 (two) times daily as needed. At bedtime       . memantine (NAMENDA) 10 MG tablet Take 10 mg by mouth 2 (two) times daily.        . Multiple Vitamin (MULTIVITAMIN) tablet Take 1 tablet by mouth daily.        . primidone (MYSOLINE) 50 MG tablet Take 50 mg by mouth at bedtime.        . sertraline (ZOLOFT) 50 MG tablet Take 50 mg by mouth daily.       Review of Systems Review of Systems  Constitutional: Negative for diaphoresis, activity change, appetite change and unexpected weight change.  - in fact wt increased 2 lbs with better diet recent HENT: Negative for ear pain, facial swelling, mouth sores and neck stiffness.   Eyes: Negative for pain, redness and visual disturbance.  Respiratory: Negative for shortness of breath and wheezing.   Cardiovascular: Negative for chest pain and  palpitations.  Gastrointestinal: Negative for diarrhea, blood in stool, abdominal distention and rectal pain.  Genitourinary: Negative for hematuria, flank pain and decreased urine volume.  Musculoskeletal: Negative for myalgias and joint swelling.  Skin: Negative for color change and wound.  Neurological: Negative for syncope and numbness.  Hematological: Negative for adenopathy.    Objective:   Physical Exam BP 112/70  Pulse 75  Temp 97.2 F (36.2 C) (Oral)  Ht 5\' 9"  (1.753 m)  Wt 156 lb 4 oz (70.875 kg)  BMI 23.07 kg/m2  SpO2 97% Physical Exam  VS noted Constitutional: Pt is oriented to person, place, and time. Appears well-developed and  well-nourished.  HENT:  Head: Normocephalic and atraumatic.  Right Ear: External ear normal.  Left Ear: External ear normal.  Nose: Nose normal.  Mouth/Throat: Oropharynx is clear and moist.  Eyes: Conjunctivae and EOM are normal. Pupils are equal, round, and reactive to light.  Neck: Normal range of motion. Neck supple. No JVD present. No tracheal deviation present.  Cardiovascular: Normal rate, regular rhythm, normal heart sounds and intact distal pulses.   Pulmonary/Chest: Effort normal and breath sounds normal.  Abdominal: Soft. Bowel sounds are normal. There is no tenderness.  Musculoskeletal: Normal range of motion. Exhibits no edema.  Lymphadenopathy:  Has no cervical adenopathy.  Neurological: Pt is alert and oriented to person, place, and time. Pt has normal reflexes. No cranial nerve deficit. + tremor and shaky with anxiety Skin: Skin is warm and dry. No rash noted.  Psychiatric: 1+ nervous    Assessment & Plan:

## 2011-12-13 NOTE — Assessment & Plan Note (Signed)

## 2011-12-13 NOTE — Assessment & Plan Note (Signed)
stable overall by hx and exam, most recent data reviewed with pt, and pt to continue medical treatment as before Lab Results  Component Value Date   HGBA1C 5.4 11/02/2008

## 2011-12-13 NOTE — Patient Instructions (Addendum)
OK for finger food diet, and continue with OT  Please keep your appointments with your specialists as you have planned - neurology as you do Please go to LAB in the [facility] for the blood and/or urine tests to be done today You will be contacted by phone if any changes need to be made immediately.  Otherwise, you will receive a letter about your results with an explanation, but please check Mychart first. Thank you for enrolling in MyChart. Please follow the instructions below to securely access your online medical record. MyChart allows you to send messages to your doctor, view your test results, renew your prescriptions, schedule appointments, and more. You are given the note about your flu shot today,and the note about the diet Please return in 6 months, or sooner if needed

## 2012-03-05 ENCOUNTER — Encounter: Payer: Self-pay | Admitting: Internal Medicine

## 2012-03-05 ENCOUNTER — Ambulatory Visit (INDEPENDENT_AMBULATORY_CARE_PROVIDER_SITE_OTHER): Payer: Medicare Other | Admitting: Internal Medicine

## 2012-03-05 VITALS — BP 118/80 | HR 67 | Temp 97.4°F | Ht 69.0 in | Wt 160.0 lb

## 2012-03-05 DIAGNOSIS — R066 Hiccough: Secondary | ICD-10-CM

## 2012-03-05 MED ORDER — BACLOFEN 10 MG PO TABS: 10.0000 mg | ORAL_TABLET | Freq: Three times a day (TID) | ORAL | Status: DC

## 2012-03-05 NOTE — Progress Notes (Signed)
Subjective:    Patient ID: Phillip Gonzales, male    DOB: 03-Jan-1935, 78 y.o.   MRN: [id number]  HPI  Pt presents to the clinic today with c/o hiccups x 3 days. He has tried mechanical maneuvers such as holding his breath and drinking cold water but nothing is helping. He was awake all night last night with the hiccups. He has had them intermittently before but never any that lasted this long.  Review of Systems      Past Medical History  Diagnosis Date  . GLUCOSE INTOLERANCE 11/02/2008  . HYPERLIPIDEMIA 02/05/2007  . Other specified forms of hearing loss 06/07/2009  . ALLERGIC RHINITIS 06/07/2009  . CHOLELITHIASIS 06/07/2009  . BACK PAIN 02/05/2007  . LEG CRAMPS, NOCTURNAL 06/16/2008  . DIZZINESS 11/09/2008  . FATIGUE 11/02/2008  . TREMOR 06/16/2008  . DYSPNEA 11/02/2008  . Hepatomegaly 11/09/2008  . Impaired glucose tolerance 04/14/2011  . Dementia 04/14/2011  . Depression 04/14/2011    Current Outpatient Prescriptions  Medication Sig Dispense Refill  . aspirin 81 MG EC tablet Take 81 mg by mouth daily.        . Diclofenac Sodium (PENNSAID) 1.5 % SOLN Place 0.5 mLs onto the skin 3 (three) times daily. Rub onto painful joints of left hand and left fingers  150 mL  11  . diphenhydrAMINE (BENADRYL) 25 MG tablet Take 25 mg by mouth daily.        Marland Kitchen donepezil (ARICEPT) 10 MG tablet Take 10 mg by mouth daily.        . famotidine (PEPCID AC MAXIMUM STRENGTH) 20 MG tablet Take 20 mg by mouth 2 (two) times daily as needed. At bedtime       . memantine (NAMENDA) 10 MG tablet Take 10 mg by mouth 2 (two) times daily.        . Multiple Vitamin (MULTIVITAMIN) tablet Take 1 tablet by mouth daily.        . primidone (MYSOLINE) 50 MG tablet Take 50 mg by mouth at bedtime.        . sertraline (ZOLOFT) 50 MG tablet Take 50 mg by mouth daily.        No Known Allergies  Family History  Problem Relation Age of Onset  . Heart disease Mother   . Alcohol abuse Father     Alcoholism/Addiction  . Cancer  Father     Throat cancer (was a smoker)    History   Social History  . Marital Status: Widowed    Spouse Name: N/A    Number of Children: N/A  . Years of Education: N/A   Occupational History  . Not on file.   Social History Main Topics  . Smoking status: Never Smoker   . Smokeless tobacco: Not on file  . Alcohol Use: No  . Drug Use: No  . Sexually Active: Not Currently   Other Topics Concern  . Not on file   Social History Narrative  . No narrative on file     Constitutional: Denies fever, malaise, fatigue, headache or abrupt weight changes.  Respiratory: Denies difficulty breathing, shortness of breath, cough or sputum production.   Cardiovascular: Denies chest pain, chest tightness, palpitations or swelling in the hands or feet.  Gastrointestinal: Pt reports hiccups. Denies abdominal pain, bloating, constipation, diarrhea or blood in the stool.    No other specific complaints in a complete review of systems (except as listed in HPI above).  Objective:   Physical Exam   BP 118/80  Pulse 67  Temp 97.4 F (36.3 C) (Oral)  Ht 5\' 9"  (1.753 m)  Wt 160 lb (72.576 kg)  BMI 23.63 kg/m2  SpO2 97% Wt Readings from Last 3 Encounters:  03/05/12 160 lb (72.576 kg)  12/13/11 156 lb 4 oz (70.875 kg)  11/01/11 155 lb 12 oz (70.648 kg)    General: Appears his stated age, well developed, well nourished in NAD. Cardiovascular: Normal rate and rhythm. S1,S2 noted.  No murmur, rubs or gallops noted. No JVD or BLE edema. No carotid bruits noted. Pulmonary/Chest: Normal effort and positive vesicular breath sounds. No respiratory distress. No wheezes, rales or ronchi noted.  Abdomen: Constant hiccups. Soft and nontender. Normal bowel sounds, no bruits noted. No distention or masses noted. Liver, spleen and kidneys non palpable.      Assessment & Plan:   Hiccups, new onset with additional workup required:  Will start Baclofen 10 mg TID prn Information given to pt about  hiccups  RTC as needed or if symptoms persist

## 2012-03-05 NOTE — Patient Instructions (Signed)
Hiccups Hiccups are caused by a sudden contraction of the muscles between the ribs and the muscle under your lungs (diaphragm). When you hiccup, the top of your windpipe (glottis) closes immediately after your diaphragm contracts. This makes the typical 'hic' sound. A hiccup is a reflex that you cannot stop. Unlike other reflexes such as coughing and sneezing, hiccups do not seem to have any useful purpose. There are 3 types of hiccups:    Benign bouts: last less than 48 hours.   Persistent: last more than 48 hours but less than 1 month.   Intractable: last more than 1 month.  CAUSES   Most people have bouts of hiccups from time to time. They start for no apparent reason, last a short while, and then stop. Sometimes they are due to:  A temporary swollen stomach caused by overeating or eating too fast, eating spicy foods, drinking fizzy drinks, or swallowing air.   A sudden change in temperature (very hot or cold foods or drinks, a cold shower).   Drinking alcohol or using tobacco.  There are no particular tests used to diagnose hiccups. Hiccups are usually considered harmless and do not point to a serious medical condition. However, there can be underlying medical problems that may cause hiccups, such as pneumonia, diabetes, metabolic problems, tumors, abdominal infections or injuries, and neurologic problems. You must follow up with your caregiver if your symptoms persist or become a frequent problem. TREATMENT    Most cases need no treatment. A bout of benign hiccups usually does not last long.   Medicine is sometimes needed to stop persistent hiccups. Medicine may be given intravenously (IV) or by mouth.   Hypnosis or acupuncture may be suggested.   Surgery to affect the nerve that supplies the diaphragm may be tried in severe cases.   Treatment of an underlying cause is needed in some cases.  HOME CARE INSTRUCTIONS   Popular remedies that may stop a bout of hiccups  include:  Gargling ice water.   Swallowing granulated sugar.   Biting on a lemon.   Holding your breath, breathing fast, or breathing into a paper bag.   Bearing down.   Gasping after a sudden fright.   Pulling your tongue gently.   Distraction.  SEEK MEDICAL CARE IF:    Hiccups last for more than 48 hours.   You are given medicine but your hiccups do not get better.   New symptoms show up.   You cannot sleep or eat due to the hiccups.   You have unexpected weight loss.   You have trouble breathing or swallowing.   You develop severe pain in your abdomen or other areas.   You develop numbness, tingling, or weakness.  Document Released: 04/23/2001 Document Revised: 05/07/2011 Document Reviewed: 04/05/2010 [facility] Patient Information 2013 [place], [place].

## 2012-03-07 ENCOUNTER — Encounter (HOSPITAL_COMMUNITY): Payer: Self-pay | Admitting: Emergency Medicine

## 2012-03-07 ENCOUNTER — Emergency Department (HOSPITAL_COMMUNITY): Payer: Medicare Other

## 2012-03-07 ENCOUNTER — Ambulatory Visit: Payer: Medicare Other | Admitting: Internal Medicine

## 2012-03-07 ENCOUNTER — Emergency Department (HOSPITAL_COMMUNITY)
Admission: EM | Admit: 2012-03-07 | Discharge: 2012-03-07 | Disposition: A | Discharge status: Home / Self Care | Payer: Medicare Other | Attending: Emergency Medicine | Admitting: Emergency Medicine

## 2012-03-07 DIAGNOSIS — K8020 Calculus of gallbladder without cholecystitis without obstruction: Secondary | ICD-10-CM | POA: Insufficient documentation

## 2012-03-07 DIAGNOSIS — E739 Lactose intolerance, unspecified: Secondary | ICD-10-CM | POA: Insufficient documentation

## 2012-03-07 DIAGNOSIS — H918X9 Other specified hearing loss, unspecified ear: Secondary | ICD-10-CM | POA: Insufficient documentation

## 2012-03-07 DIAGNOSIS — R4789 Other speech disturbances: Secondary | ICD-10-CM | POA: Insufficient documentation

## 2012-03-07 DIAGNOSIS — R259 Unspecified abnormal involuntary movements: Secondary | ICD-10-CM | POA: Insufficient documentation

## 2012-03-07 DIAGNOSIS — R4182 Altered mental status, unspecified: Secondary | ICD-10-CM | POA: Insufficient documentation

## 2012-03-07 DIAGNOSIS — R252 Cramp and spasm: Secondary | ICD-10-CM | POA: Insufficient documentation

## 2012-03-07 DIAGNOSIS — M549 Dorsalgia, unspecified: Secondary | ICD-10-CM | POA: Insufficient documentation

## 2012-03-07 DIAGNOSIS — F3289 Other specified depressive episodes: Secondary | ICD-10-CM | POA: Insufficient documentation

## 2012-03-07 DIAGNOSIS — F0280 Dementia in other diseases classified elsewhere without behavioral disturbance: Secondary | ICD-10-CM | POA: Insufficient documentation

## 2012-03-07 DIAGNOSIS — E785 Hyperlipidemia, unspecified: Secondary | ICD-10-CM | POA: Insufficient documentation

## 2012-03-07 DIAGNOSIS — J309 Allergic rhinitis, unspecified: Secondary | ICD-10-CM | POA: Insufficient documentation

## 2012-03-07 DIAGNOSIS — Z7982 Long term (current) use of aspirin: Secondary | ICD-10-CM | POA: Insufficient documentation

## 2012-03-07 DIAGNOSIS — Z79899 Other long term (current) drug therapy: Secondary | ICD-10-CM | POA: Insufficient documentation

## 2012-03-07 DIAGNOSIS — G309 Alzheimer's disease, unspecified: Secondary | ICD-10-CM | POA: Insufficient documentation

## 2012-03-07 DIAGNOSIS — F329 Major depressive disorder, single episode, unspecified: Secondary | ICD-10-CM | POA: Insufficient documentation

## 2012-03-07 HISTORY — DX: Alzheimer's disease, unspecified: G30.9

## 2012-03-07 HISTORY — DX: Dementia in other diseases classified elsewhere, unspecified severity, without behavioral disturbance, psychotic disturbance, mood disturbance, and anxiety: F02.80

## 2012-03-07 LAB — COMPREHENSIVE METABOLIC PANEL
Alkaline Phosphatase: 56 U/L (ref 39–117)
BUN: 20 mg/dL (ref 6–23)
CO2: 28 mEq/L (ref 19–32)
GFR calc Af Amer: 72 mL/min — ABNORMAL LOW (ref >90)
GFR calc non Af Amer: 62 mL/min — ABNORMAL LOW (ref >90)
Glucose, Bld: 137 mg/dL — ABNORMAL HIGH (ref 70–99)
Potassium: 3.6 mEq/L (ref 3.5–5.1)
Total Protein: 6.7 g/dL (ref 6.0–8.3)

## 2012-03-07 LAB — CBC
HCT: 38 % — ABNORMAL LOW (ref 39.0–52.0)
Hemoglobin: 12.7 g/dL — ABNORMAL LOW (ref 13.0–17.0)
MCH: 31.4 pg (ref 26.0–34.0)
MCHC: 33.4 g/dL (ref 30.0–36.0)
RBC: 4.05 MIL/uL — ABNORMAL LOW (ref 4.22–5.81)

## 2012-03-07 LAB — URINALYSIS, ROUTINE W REFLEX MICROSCOPIC
Bilirubin Urine: NEGATIVE
Ketones, ur: NEGATIVE mg/dL
Leukocytes, UA: NEGATIVE
Nitrite: NEGATIVE
Protein, ur: NEGATIVE mg/dL
Urobilinogen, UA: 0.2 mg/dL (ref 0.0–1.0)
pH: 5.5 (ref 5.0–8.0)

## 2012-03-07 MED ORDER — FENTANYL CITRATE 0.05 MG/ML IJ SOLN
50.0000 ug | Freq: Once | INTRAMUSCULAR | Status: AC
  Administered 2012-03-07: 50 ug via INTRAVENOUS
  Filled 2012-03-07: qty 2

## 2012-03-07 MED ORDER — LORAZEPAM 1 MG PO TABS
1.0000 mg | ORAL_TABLET | Freq: Once | ORAL | Status: AC
  Administered 2012-03-07: 1 mg via ORAL
  Filled 2012-03-07: qty 1

## 2012-03-07 NOTE — Progress Notes (Signed)
CSW met with pt daughters along with pt edp. Pt with history or alzheimer's. Per discussion with EDP pt dementia may be worsening and had concerns regarding pt continued care at [facility]. CSW spoke with Tresa Endo at [facility] and assured CSW that patient needs can and will be met. [facility] is able to provide all levels of care at ALF with assistance with all ADLS and dementia care needs. CSW discussed with EDP.   Pt plans to return to [facility] when medically stable.   Catha Gosselin, LCSWA  [phone] .03/07/2012 1835pm

## 2012-03-07 NOTE — ED Notes (Signed)
In and Out did not produce any urine

## 2012-03-07 NOTE — ED Provider Notes (Signed)
History     CSN: [id number]  Arrival date & time 03/07/12  1440   First MD Initiated Contact with Patient 03/07/12 1501      Chief Complaint  Patient presents with  . Altered Mental Status    (Consider location/radiation/quality/duration/timing/severity/associated sxs/prior treatment) HPI Comments: RITA PROM is a 78 y.o. male w a hx of Dementia & Alzheimers that presents to the ER from his assisted living facility [facility]) for having altered mental status. Pts daughters report that at baseline he is communities and able to ambulate without difficulty. Since yesterday he has appeared to be in a more "foggy state, staring off into space" and has had new muscle spasms. The pt normally suffers from mils tremors, but the spasticity is new. Also new is temporary difficulty speaking. Unable to obtain more information as pt is a level V caveat.    Past Medical History  Diagnosis Date  . GLUCOSE INTOLERANCE 11/02/2008  . HYPERLIPIDEMIA 02/05/2007  . Other specified forms of hearing loss 06/07/2009  . ALLERGIC RHINITIS 06/07/2009  . CHOLELITHIASIS 06/07/2009  . BACK PAIN 02/05/2007  . LEG CRAMPS, NOCTURNAL 06/16/2008  . DIZZINESS 11/09/2008  . FATIGUE 11/02/2008  . TREMOR 06/16/2008  . DYSPNEA 11/02/2008  . Hepatomegaly 11/09/2008  . Impaired glucose tolerance 04/14/2011  . Dementia 04/14/2011  . Depression 04/14/2011  . Alzheimer disease     Past Surgical History  Procedure Date  . Left and right hand surgury     Family History  Problem Relation Age of Onset  . Heart disease Mother   . Alcohol abuse Father     Alcoholism/Addiction  . Cancer Father     Throat cancer (was a smoker)    History  Substance Use Topics  . Smoking status: Never Smoker   . Smokeless tobacco: Not on file  . Alcohol Use: No      Review of Systems  Unable to perform ROS   Allergies  Review of patient's allergies indicates no known allergies.  Home Medications   Current Outpatient Rx   Name  Route  Sig  Dispense  Refill  . ALCAFTADINE 0.25 % OP SOLN   Both Eyes   Place 1 drop into both eyes daily as needed. For allergies.         . ASPIRIN 81 MG PO TBEC   Oral   Take 81 mg by mouth daily.           Marland Kitchen BACLOFEN 10 MG PO TABS   Oral   Take 1 tablet (10 mg total) by mouth 3 (three) times daily.   30 each   0   . CAPSAICIN 0.025 % EX CREA   Topical   Apply 1 application topically 3 (three) times daily as needed. For pain.         Marland Kitchen DIPHENHYDRAMINE HCL 25 MG PO TABS   Oral   Take 25 mg by mouth at bedtime.          . DONEPEZIL HCL 10 MG PO TABS   Oral   Take 10 mg by mouth at bedtime.          Marland Kitchen FAMOTIDINE 20 MG PO TABS   Oral   Take 20 mg by mouth 2 (two) times daily as needed. For acid reflux.         Marland Kitchen HYDROXYZINE HCL 50 MG PO TABS   Oral   Take 50 mg by mouth every 6 (six) hours as needed. For itching.         Marland Kitchen  KETOTIFEN FUMARATE 0.025 % OP SOLN   Both Eyes   Place 1 drop into both eyes 2 (two) times daily.         Marland Kitchen MEMANTINE HCL 10 MG PO TABS   Oral   Take 10 mg by mouth 2 (two) times daily.           . ADULT MULTIVITAMIN W/MINERALS CH   Oral   Take 1 tablet by mouth daily.         Marland Kitchen PRIMIDONE 50 MG PO TABS   Oral   Take 100 mg by mouth at bedtime.          . PSYLLIUM 58.6 % PO POWD   Oral   Take 1 packet by mouth daily as needed. For constipation.         . SERTRALINE HCL 100 MG PO TABS   Oral   Take 100 mg by mouth daily.           BP 107/56  Pulse 65  Temp 98.1 F (36.7 C) (Oral)  Resp 18  SpO2 100%  Physical Exam  Nursing note and vitals reviewed. Constitutional: He appears well-developed and well-nourished. No distress.  HENT:  Head: Normocephalic and atraumatic.  Eyes: Conjunctivae normal and EOM are normal. Pupils are equal, round, and reactive to light. No scleral icterus.  Neck: Normal range of motion. Neck supple.  Cardiovascular: Normal rate, regular rhythm, normal heart sounds and  intact distal pulses.   Pulmonary/Chest: Effort normal. No respiratory distress. He has no wheezes. He has no rales. He exhibits no tenderness.  Abdominal: Soft. There is no tenderness.  Musculoskeletal: He exhibits no edema and no tenderness.  Neurological: He is alert. He displays a negative Romberg sign.        No facial paralysis, intermittent dysarthria, patient is alert and oriented to self. Unable to asses cranial nerves d/t pts inability to follow commands. Strength 5/5 bilaterally. Unable to assess sensation.    Skin: Skin is warm and dry. No petechiae, no purpura and no rash noted. He is not diaphoretic.  Psychiatric: His speech is not slurred. Cognition and memory are impaired.    ED Course  Procedures (including critical care time)  Labs Reviewed  CBC - Abnormal; Notable for the following:    RBC 4.05 (*)     Hemoglobin 12.7 (*)     HCT 38.0 (*)     All other components within normal limits  COMPREHENSIVE METABOLIC PANEL - Abnormal; Notable for the following:    Glucose, Bld 137 (*)     Total Bilirubin 0.2 (*)     GFR calc non Af Amer 62 (*)     GFR calc Af Amer 72 (*)     All other components within normal limits  URINALYSIS, ROUTINE W REFLEX MICROSCOPIC  GLUCOSE, CAPILLARY  URINE MICROSCOPIC-ADD ON   Ct Head Wo Contrast (only If Suspected Head Trauma And/or Pt Is On Anticoagulant)  03/07/2012  *RADIOLOGY REPORT*  Clinical Data: Brief period of loss of cognition earlier today, now with increased confusion and tremors.  Current history of Alzheimer's dementia.  CT HEAD WITHOUT CONTRAST  Technique:  Contiguous axial images were obtained from the base of the skull through the vertex without contrast.  Comparison: None.  Findings: Geographic low attenuation involving the left cerebellar hemisphere and the right cerebellar peduncle.  Moderate to severe cortical and deep atrophy and severe changes of small vessel disease of the white matter diffusely.  Focal lacunar strokes  in  the cerebellar vermis and in the brainstem.  No mass lesion.  No midline shift.  No acute hemorrhage or hematoma.  No extra-axial fluid collections.  No skull fracture or other focal osseous abnormality involving the skull.  Fluid and scattered inferior bilateral mastoid air cells. Middle ear cavities well-aerated bilaterally.  Small mucous retention cysts or polyps in the left maxillary sinus and mild mucosal thickening involving the right sphenoid sinus.  Remaining visualized paranasal sinuses well-aerated. Bony nasal septal deviation to the right.  IMPRESSION:  1.  Age indeterminate non-hemorrhagic left cerebellar hemispheric stroke and possible non-hemorrhagic stroke in the right cerebellar peduncle. 2.  Moderate to severe generalized atrophy and chronic microvascular ischemic changes of the white matter.  Lacunar strokes in the cerebellar vermis and the brainstem. 3.  Chronic right sphenoid and left maxillary sinusitis.  Minimal bilateral mastoid effusions.  These results were called by telephone on 03/07/2012 at 1615 hours to [facility], [place], of the emergency department, who verbally acknowledged these results.   Original Report Authenticated By: Hulan Saas, M.D.    Mr Brain Wo Contrast  03/07/2012  *RADIOLOGY REPORT*  Clinical Data: 78 year old male with altered mental status, tremor, confusion.  MRI HEAD WITHOUT CONTRAST  Technique:  Multiplanar, multiecho pulse sequences of the brain and surrounding structures were obtained according to standard protocol without intravenous contrast.  Comparison: Head CT without contrast 03/07/2012.  Findings: No restricted diffusion to suggest acute infarction.  The hypodensity suspected on the comparison CT is felt to have been artifact from the skull base streak.  There are small chronic lacunar infarcts in both cerebellar hemispheres (series 5 image 7).  No acute cerebellar signal abnormality or edema.  Disproportionate peri-sylvian volume loss.  Possibly some  right temporal lobe / opercular encephalomalacia. There is also biparietal volume loss.  Ex vacuo changes to the ventricles. Nonspecific periventricular white matter T2 and FLAIR hyperintensity, not felt related to transependymal edema.  Major intracranial vascular flow voids are preserved.  Negative pituitary, cervicomedullary junction and visualized cervical spine. Normal bone marrow signal. No acute intracranial hemorrhage identified.  Evidence of a small chronic micro hemorrhage in the left occipital lobe on series 7 image 13.  Possible chronic micro hemorrhage in the left cerebellum on image five.  Postoperative changes to the globes.  Mild sphenoid sinus mucosal thickening.  Other Visualized paranasal sinuses and mastoids are clear.  Negative scalp soft tissues.  IMPRESSION: 1. No acute intracranial abnormality. 2.  Chronic small vessel ischemia, most pronounced in the cerebellar hemispheres. 3.  Disproportionate temporal and parietal lobe volume loss as can be seen in Alzheimer's type dementia.  Suspect ex vacuo changes to the lateral ventricles.   Original Report Authenticated By: Erskine Speed, M.D.      No diagnosis found.    MDM  Alt mental status  Phillip Gonzales is a 78 y.o. male w a hx of Alzheimer presented to ER with mental status change. Pt evaluated for presentation of alt mental status with likely etiology of worsening dementia. [facility] where he has excellent home care and it is felt he can be dc with PCP follow up. No acute abnormalities found on MRI and labs found to be unremarkable. Pt seen and discussed with attending Dr. Juleen China who agrees with disposition plan.  At this time there does not appear to be any evidence of an acute emergency medical condition and the patient appears stable for discharge with appropriate outpatient follow up.Diagnosis was  discussed with patient who verbalizes understanding and is agreeable to discharge          Jaci Carrel,  [place] 03/07/12 2107

## 2012-03-07 NOTE — ED Notes (Signed)
Below normal temp reported to Dr. Silverio Lay

## 2012-03-07 NOTE — Discharge Instructions (Signed)
Altered Mental Status  Altered mental status most often refers to an abnormal change in your responsiveness and awareness. It can affect your speech, thought, mobility, memory, attention span, or alertness. It can range from slight confusion to complete unresponsiveness (coma). Altered mental status can be a sign of a serious underlying medical condition. Rapid evaluation and medical treatment is necessary for patients having an altered mental status.  CAUSES    Low blood sugar (hypoglycemia) or diabetes.   Severe loss of body fluids (dehydration) or a body salt (electrolyte) imbalance.   A stroke or other neurologic problem, such as dementia or delirium.   A head injury or tumor.   A drug or alcohol overdose.   Exposure to toxins or poisons.   Depression, anxiety, and stress.   A low oxygen level (hypoxia).   An infection.   Blood loss.   Twitching or shaking (seizure).   Heart problems, such as heart attack or heart rhythm problems (arrhythmias).   A body temperature that is too low or too high (hypothermia or hyperthermia).  DIAGNOSIS   A diagnosis is based on your history, symptoms, physical and neurologic examinations, and diagnostic tests. Diagnostic tests may include:   Measurement of your blood pressure, pulse, breathing, and oxygen levels (vital signs).   Blood tests.   Urine tests.   X-ray exams.   A computerized magnetic scan (magnetic resonance imaging, MRI).   A computerized X-ray scan (computed tomography, CT scan).  TREATMENT   Treatment will depend on the cause. Treatment may include:   Management of an underlying medical or mental health condition.   Critical care or support in the hospital.  HOME CARE INSTRUCTIONS    Only take over-the-counter or prescription medicines for pain, discomfort, or fever as directed by your caregiver.   Manage underlying conditions as directed by your caregiver.   Eat a healthy, well-balanced diet to maintain strength.   Join a support group or  prevention program to cope with the condition or trauma that caused the altered mental status. Ask your caregiver to help choose a program that works for you.   Follow up with your caregiver for further examination, therapy, or testing as directed.  SEEK MEDICAL CARE IF:    You feel unwell or have chills.   You or your family notice a change in your behavior or your alertness.   You have trouble following your caregiver's treatment plan.   You have questions or concerns.  SEEK IMMEDIATE MEDICAL CARE IF:    You have a rapid heartbeat or have chest pain.   You have difficulty breathing.   You have a fever.   You have a headache with a stiff neck.   You cough up blood.   You have blood in your urine or stool.   You have severe agitation or confusion.  MAKE SURE YOU:    Understand these instructions.   Will watch your condition.   Will get help right away if you are not doing well or get worse.  Document Released: 08/02/2009 Document Revised: 05/07/2011 Document Reviewed: 08/02/2009  ExitCare Patient Information 2013 [employer].

## 2012-03-07 NOTE — ED Notes (Signed)
[id number]<JY> Expected date:<BR> Expected time:<BR> Means of arrival:<BR> Comments:<BR> ems/aloc

## 2012-03-07 NOTE — ED Notes (Addendum)
Per EMS: From [facility], family states pt has brief period loss of cognitive abilities "staring off into space". Increased confusion and tremors.

## 2012-03-07 NOTE — ED Notes (Signed)
Family states that tremors have gotten worse. Pt has brief periods where he will just stare into space but pt was not having these episodes yesterday.

## 2012-03-07 NOTE — ED Notes (Signed)
Patient transported to MRI 

## 2012-03-07 NOTE — ED Notes (Signed)
Patient transported to CT 

## 2012-03-08 ENCOUNTER — Encounter: Payer: Self-pay | Admitting: Family Medicine

## 2012-03-08 ENCOUNTER — Ambulatory Visit (INDEPENDENT_AMBULATORY_CARE_PROVIDER_SITE_OTHER): Payer: Medicare Other | Admitting: Family Medicine

## 2012-03-08 VITALS — BP 120/70 | HR 65 | Temp 97.5°F | Resp 16

## 2012-03-08 DIAGNOSIS — R404 Transient alteration of awareness: Secondary | ICD-10-CM

## 2012-03-08 DIAGNOSIS — R400 Somnolence: Secondary | ICD-10-CM | POA: Insufficient documentation

## 2012-03-08 NOTE — Patient Instructions (Addendum)
Stop the baclofen  Hold the Benadryl, Atarax, Zoloft,  Call Dr. Jonny Ruiz on Tuesday and he will advise you how to restart the above 3 medications.  If his symptoms get worse bring him to the hospital for admission

## 2012-03-08 NOTE — Progress Notes (Signed)
  Subjective:    Patient ID: Phillip Gonzales, male    DOB: 1934/11/08, 78 y.o.   MRN: [id number]  HPI Phillip Gonzales is a 78 year old male who has underlying dementia. He is a resident at [facility]. He was seen here last Wednesday because of hiccups. He was given baclofen 3 times daily. After 6 doses of the medication he became very somnolent and noncommunicative. He was taken to [facility] long emerge from had a complete workup including MRI CT scans etc. etc. I felt that his symptoms were related to the baclofen however they did not stop his medication. They were told that his primary care physician needed to stop the medication   the 2 daughters bring him in today very frustrated because he's continuing to get the medication with the above symptoms.    Review of Systems    review of systems otherwise negative Objective:   Physical Exam Well-developed well-nourished male very somnolent quarter inch cut midforehead from previous fall yesterday       Assessment & Plan:  CNS side effects from baclofen plan stop baclofen hold other medications followup with PCP on Tuesday

## 2012-03-10 ENCOUNTER — Telehealth: Payer: Self-pay

## 2012-03-10 ENCOUNTER — Telehealth: Payer: Self-pay | Admitting: Internal Medicine

## 2012-03-10 NOTE — Telephone Encounter (Signed)
[facility] informed of MD instructions to restart Zoloft only.

## 2012-03-10 NOTE — Telephone Encounter (Signed)
Ok to re-start zoloft only

## 2012-03-10 NOTE — Telephone Encounter (Signed)
Daughter is calling regarding fax sent over from [facility] to restart Zoloft today.  Pt was taken off medication on 03/08/12.  Per EP[facility], Dr Jonny Ruiz had already given the ok to restart Zoloft only.  RN informed daughter that Joselyn Arrow was made aware also.  Daughter has no other questions at this time.

## 2012-03-10 NOTE — Telephone Encounter (Signed)
The family would like an ok to restart Zoloft today.  Call back number to [facility] is [phone]

## 2012-03-11 NOTE — ED Provider Notes (Signed)
Medical screening examination/treatment/procedure(s) were conducted as a shared visit with non-physician practitioner(s) and myself.  I personally evaluated the patient during the encounter.  Patient's workup was unrevealing and he is appropriate for discharge.  Gilda Crease, MD 03/11/12 1407

## 2012-03-31 ENCOUNTER — Other Ambulatory Visit: Payer: Self-pay

## 2012-03-31 MED ORDER — KETOTIFEN FUMARATE 0.025 % OP SOLN: 1.0000 [drp] | Freq: Two times a day (BID) | OPHTHALMIC | Status: DC

## 2012-05-26 ENCOUNTER — Telehealth: Payer: Self-pay

## 2012-05-26 MED ORDER — KETOTIFEN FUMARATE 0.025 % OP SOLN: 1.0000 [drp] | Freq: Two times a day (BID) | OPHTHALMIC | Status: DC

## 2012-05-26 NOTE — Telephone Encounter (Signed)
Refilled medication

## 2013-01-01 ENCOUNTER — Other Ambulatory Visit: Payer: Self-pay

## 2013-01-16 ENCOUNTER — Emergency Department (HOSPITAL_COMMUNITY): Payer: Medicare Other

## 2013-01-16 ENCOUNTER — Emergency Department (HOSPITAL_COMMUNITY)
Admission: EM | Admit: 2013-01-16 | Discharge: 2013-01-16 | Disposition: A | Discharge status: Home / Self Care | Payer: Medicare Other | Attending: Emergency Medicine | Admitting: Emergency Medicine

## 2013-01-16 ENCOUNTER — Encounter (HOSPITAL_COMMUNITY): Payer: Self-pay | Admitting: Emergency Medicine

## 2013-01-16 DIAGNOSIS — W1809XA Striking against other object with subsequent fall, initial encounter: Secondary | ICD-10-CM | POA: Insufficient documentation

## 2013-01-16 DIAGNOSIS — Z862 Personal history of diseases of the blood and blood-forming organs and certain disorders involving the immune mechanism: Secondary | ICD-10-CM | POA: Insufficient documentation

## 2013-01-16 DIAGNOSIS — F3289 Other specified depressive episodes: Secondary | ICD-10-CM | POA: Insufficient documentation

## 2013-01-16 DIAGNOSIS — Z8709 Personal history of other diseases of the respiratory system: Secondary | ICD-10-CM | POA: Insufficient documentation

## 2013-01-16 DIAGNOSIS — Z79899 Other long term (current) drug therapy: Secondary | ICD-10-CM | POA: Insufficient documentation

## 2013-01-16 DIAGNOSIS — R259 Unspecified abnormal involuntary movements: Secondary | ICD-10-CM | POA: Insufficient documentation

## 2013-01-16 DIAGNOSIS — G309 Alzheimer's disease, unspecified: Secondary | ICD-10-CM | POA: Insufficient documentation

## 2013-01-16 DIAGNOSIS — Y9389 Activity, other specified: Secondary | ICD-10-CM | POA: Insufficient documentation

## 2013-01-16 DIAGNOSIS — F0280 Dementia in other diseases classified elsewhere without behavioral disturbance: Secondary | ICD-10-CM | POA: Insufficient documentation

## 2013-01-16 DIAGNOSIS — Z8719 Personal history of other diseases of the digestive system: Secondary | ICD-10-CM | POA: Insufficient documentation

## 2013-01-16 DIAGNOSIS — H9190 Unspecified hearing loss, unspecified ear: Secondary | ICD-10-CM | POA: Insufficient documentation

## 2013-01-16 DIAGNOSIS — S0990XA Unspecified injury of head, initial encounter: Secondary | ICD-10-CM | POA: Insufficient documentation

## 2013-01-16 DIAGNOSIS — W19XXXA Unspecified fall, initial encounter: Secondary | ICD-10-CM

## 2013-01-16 DIAGNOSIS — Y9210 Unspecified residential institution as the place of occurrence of the external cause: Secondary | ICD-10-CM | POA: Insufficient documentation

## 2013-01-16 DIAGNOSIS — F329 Major depressive disorder, single episode, unspecified: Secondary | ICD-10-CM | POA: Insufficient documentation

## 2013-01-16 DIAGNOSIS — Z8639 Personal history of other endocrine, nutritional and metabolic disease: Secondary | ICD-10-CM | POA: Insufficient documentation

## 2013-01-16 NOTE — Discharge Instructions (Signed)
Head Injury, Adult °You have had a head injury that does not appear serious at this time. A concussion is a state of changed mental ability, usually from a blow to the head. You should take clear liquids for the rest of the day and then resume your regular diet. You should not take sedatives or alcoholic beverages for as long as directed by your caregiver after discharge. After injuries such as yours, most problems occur within the first 24 hours. °SYMPTOMS °These minor symptoms may be experienced after discharge: °· Memory difficulties. °· Dizziness. °· Headaches. °· Double vision. °· Hearing difficulties. °· Depression. °· Tiredness. °· Weakness. °· Difficulty with concentration. °If you experience any of these problems, you should not be alarmed. A concussion requires a few days for recovery. Many patients with head injuries frequently experience such symptoms. Usually, these problems disappear without medical care. If symptoms last for more than one day, notify your caregiver. See your caregiver sooner if symptoms are becoming worse rather than better. °HOME CARE INSTRUCTIONS  °· During the next 24 hours you must stay with someone who can watch you for the warning signs listed below. °Although it is unlikely that serious side effects will occur, you should be aware of signs and symptoms which may necessitate your return to this location. Side effects may occur up to 7  10 days following the injury. It is important for you to carefully monitor your condition and contact your caregiver or seek immediate medical attention if there is a change in your condition. °SEEK IMMEDIATE MEDICAL CARE IF:  °· There is confusion or drowsiness. °· You can not awaken the injured person. °· There is nausea (feeling sick to your stomach) or continued, forceful vomiting. °· You notice dizziness or unsteadiness which is getting worse, or inability to walk. °· You have convulsions or unconsciousness. °· You experience severe,  persistent headaches not relieved by over-the-counter or prescription medicines for pain. (Do not take aspirin as this impairs clotting abilities). Take other pain medications only as directed. °· You can not use arms or legs normally. °· There is clear or bloody discharge from the nose or ears. °MAKE SURE YOU:  °· Understand these instructions. °· Will watch your condition. °· Will get help right away if you are not doing well or get worse. °Document Released: 02/12/2005 Document Revised: 05/07/2011 Document Reviewed: 12/31/2008 °ExitCare® Patient Information ©2014 [employer]. ° °

## 2013-01-16 NOTE — ED Notes (Signed)
Pt daughter, who is healthcare POA, declined blood work.  RN is aware.

## 2013-01-16 NOTE — ED Provider Notes (Signed)
CSN: [id number]     Arrival date & time 01/16/13  1155 History   First MD Initiated Contact with Patient 01/16/13 1202     Chief Complaint  Patient presents with  . Fall   Level 5 caveat, dementia poor memory  HPI Pt resides at a nursing home.  He walked into a different room and was found on the floor with a broken table and injury to the back of the head.  Pt has severe dementia and would not normally be able to remember what happened.  Pt denies any complaints right now.  No neck pain  Or headache.   His family states the patient has severe dementia. They decided to have him stop his dementia medications about a month ago. The patient has continued to progress rather quickly. He is more unstable than he used to be in his memory is worsening. The family suspects that he walked into someone else's room thinking that it was his own and most likely tried sitting down in a spot normally where he would have a chair. Past Medical History  Diagnosis Date  . GLUCOSE INTOLERANCE 11/02/2008  . HYPERLIPIDEMIA 02/05/2007  . Other specified forms of hearing loss 06/07/2009  . ALLERGIC RHINITIS 06/07/2009  . CHOLELITHIASIS 06/07/2009  . BACK PAIN 02/05/2007  . LEG CRAMPS, NOCTURNAL 06/16/2008  . DIZZINESS 11/09/2008  . FATIGUE 11/02/2008  . TREMOR 06/16/2008  . DYSPNEA 11/02/2008  . Hepatomegaly 11/09/2008  . Impaired glucose tolerance 04/14/2011  . Dementia 04/14/2011  . Depression 04/14/2011  . Alzheimer disease    Past Surgical History  Procedure Laterality Date  . Left and right hand surgury     Family History  Problem Relation Age of Onset  . Heart disease Mother   . Alcohol abuse Father     Alcoholism/Addiction  . Cancer Father     Throat cancer (was a smoker)   History  Substance Use Topics  . Smoking status: Never Smoker   . Smokeless tobacco: Not on file  . Alcohol Use: No    Review of Systems  Constitutional: Negative for fever.  Respiratory: Negative for shortness of breath.    Cardiovascular: Negative for chest pain.  Gastrointestinal: Negative for abdominal pain.  Genitourinary: Negative for dysuria.  Neurological: Negative for headaches.  All other systems reviewed and are negative.    Allergies  Review of patient's allergies indicates no known allergies.  Home Medications   Current Outpatient Rx  Name  Route  Sig  Dispense  Refill  . acetaminophen (TYLENOL) 325 MG tablet   Oral   Take 650 mg by mouth every 4 (four) hours as needed for mild pain.         . capsicum oleoresin (TRIXAICIN) 0.025 % cream   Topical   Apply 1 application topically 3 (three) times daily.         . cetirizine (ZYRTEC) 10 MG tablet   Oral   Take 10 mg by mouth at bedtime.         . primidone (MYSOLINE) 50 MG tablet   Oral   Take 100 mg by mouth at bedtime.          . sertraline (ZOLOFT) 100 MG tablet   Oral   Take 100 mg by mouth daily.         . famotidine (PEPCID AC MAXIMUM STRENGTH) 20 MG tablet   Oral   Take 20 mg by mouth 2 (two) times daily as needed. For acid reflux.         Marland Kitchen  psyllium (METAMUCIL) 58.6 % powder   Oral   Take 1 packet by mouth daily as needed. For constipation.          BP 123/67  Pulse 67  Temp(Src) 97.5 F (36.4 C) (Oral)  Resp 16  SpO2 99% Physical Exam  Nursing note and vitals reviewed. Constitutional: No distress.  Elderly, frail  HENT:  Head: Normocephalic and atraumatic.  Right Ear: External ear normal.  Left Ear: External ear normal.  Small approximately 1 cm superficial abrasion/skin tear posterior occiput  Eyes: Conjunctivae are normal. Right eye exhibits no discharge. Left eye exhibits no discharge. No scleral icterus.  Neck: Neck supple. No tracheal deviation present.  No cervical spinal tenderness  Cardiovascular: Normal rate, regular rhythm and intact distal pulses.   Pulmonary/Chest: Effort normal and breath sounds normal. No stridor. No respiratory distress. He has no wheezes. He has no rales.   Abdominal: Soft. Bowel sounds are normal. He exhibits no distension. There is no tenderness. There is no rebound and no guarding.  Musculoskeletal: He exhibits no edema and no tenderness.  Patient is able to move all extremities, no areas of tenderness, no tenderness to palpation thoracic and lumbar spine  Neurological: He is alert. He is disoriented. He displays tremor. No sensory deficit. Cranial nerve deficit: No facial droop noted, extraocular movements are intact, hearing and vision are intact. He exhibits normal muscle tone. He displays no seizure activity. Coordination abnormal. GCS eye subscore is 4. GCS verbal subscore is 4. GCS motor subscore is 6.  General weakness although patient is able to sit up in the bed with some assistance, he is able to and involve extremities, the patient does follow commands,  Skin: Skin is warm and dry. No rash noted.  Psychiatric: He has a normal mood and affect.    ED Course  Procedures (including critical care time) Labs Review Labs Reviewed - No data to display Imaging Review Ct Head Wo Contrast  01/16/2013   CLINICAL DATA:  Patient fell and has laceration to the back of the head.  EXAM: CT HEAD WITHOUT CONTRAST  TECHNIQUE: Contiguous axial images were obtained from the base of the skull through the vertex without intravenous contrast.  COMPARISON:  MRI and head CT, 03/07/2012  FINDINGS: Ventricles are normal in configuration. There is ventricular and sulcal enlargement reflecting moderate to advanced atrophy. This is stable.  No parenchymal masses or mass effect. Patchy white matter hypoattenuation is noted consistent with moderate chronic microvascular ischemic change. No evidence of a recent transcortical infarct.  No extra-axial masses or abnormal fluid collections.  No intracranial hemorrhage.  No skull fracture. Chronic mucosal thickening in the right sphenoid sinus. Clear mastoid air cells.  IMPRESSION: 1. No acute intracranial abnormalities. 2.  Atrophy and chronic microvascular ischemic change, stable from the prior studies.   Electronically Signed   By: Amie Portland M.D.   On: 01/16/2013 13:15     MDM   1. Fall, initial encounter    Family decided they didn't want any blood testing for her EKG testing.  This was after his head CT however he been performed. The patient's dementia is rather severe in the family has indicated that they are discontinuing some of his active treatment.  Overall I suspect the patient did have a mechanical fall. THe family understands that I can not exclude any other etiologies such as a fainting spell related to a heart condition.  They are comfortable with him going back to his facility.  Celene Kras, MD 01/16/13 [phone]

## 2013-01-16 NOTE — ED Notes (Signed)
Per EMS, pt came from [facility] nursing home. Pt fell and hit the back of his head on a bedside table, breaking the table. Pt has a skin tear on the back of his head, 1.5cm in length. Staff state patient did not lose consciousness. Pt denies pain and is at baseline mental status. Pt has a hx of dementia. Pt is not on blood thinners.

## 2013-01-16 NOTE — ED Notes (Signed)
Bed: WA09 Expected date:  Expected time:  Means of arrival:  Comments: fall 

## 2013-01-16 NOTE — ED Notes (Addendum)
Notified EDP regarding healthcare POA request to not have blood work and EKG completed.

## 2013-12-13 ENCOUNTER — Encounter (HOSPITAL_COMMUNITY): Payer: Self-pay | Admitting: Emergency Medicine

## 2013-12-13 ENCOUNTER — Emergency Department (HOSPITAL_COMMUNITY)
Admission: EM | Admit: 2013-12-13 | Discharge: 2013-12-13 | Disposition: A | Discharge status: Home / Self Care | Payer: Medicare Other | Attending: Emergency Medicine | Admitting: Emergency Medicine

## 2013-12-13 ENCOUNTER — Telehealth (HOSPITAL_BASED_OUTPATIENT_CLINIC_OR_DEPARTMENT_OTHER): Payer: Self-pay

## 2013-12-13 DIAGNOSIS — R627 Adult failure to thrive: Secondary | ICD-10-CM | POA: Diagnosis not present

## 2013-12-13 DIAGNOSIS — Z8719 Personal history of other diseases of the digestive system: Secondary | ICD-10-CM | POA: Diagnosis not present

## 2013-12-13 DIAGNOSIS — G309 Alzheimer's disease, unspecified: Secondary | ICD-10-CM | POA: Diagnosis not present

## 2013-12-13 DIAGNOSIS — F0281 Dementia in other diseases classified elsewhere with behavioral disturbance: Secondary | ICD-10-CM | POA: Insufficient documentation

## 2013-12-13 DIAGNOSIS — M25559 Pain in unspecified hip: Secondary | ICD-10-CM | POA: Diagnosis present

## 2013-12-13 DIAGNOSIS — Z8639 Personal history of other endocrine, nutritional and metabolic disease: Secondary | ICD-10-CM | POA: Diagnosis not present

## 2013-12-13 DIAGNOSIS — Z79899 Other long term (current) drug therapy: Secondary | ICD-10-CM | POA: Diagnosis not present

## 2013-12-13 DIAGNOSIS — R451 Restlessness and agitation: Secondary | ICD-10-CM

## 2013-12-13 DIAGNOSIS — F0391 Unspecified dementia with behavioral disturbance: Secondary | ICD-10-CM

## 2013-12-13 DIAGNOSIS — F329 Major depressive disorder, single episode, unspecified: Secondary | ICD-10-CM | POA: Insufficient documentation

## 2013-12-13 MED ORDER — MORPHINE SULFATE 4 MG/ML IJ SOLN
4.0000 mg | Freq: Once | INTRAMUSCULAR | Status: AC
  Administered 2013-12-13: 4 mg via INTRAMUSCULAR
  Filled 2013-12-13: qty 1

## 2013-12-13 MED ORDER — MORPHINE SULFATE (CONCENTRATE) 20 MG/ML PO SOLN: 10.0000 mg | ORAL | Status: AC | PRN

## 2013-12-13 MED ORDER — LORAZEPAM 2 MG/ML PO CONC: 1.0000 mg | Freq: Four times a day (QID) | ORAL | Status: AC | PRN

## 2013-12-13 MED ORDER — LORAZEPAM 2 MG/ML IJ SOLN
1.0000 mg | Freq: Once | INTRAMUSCULAR | Status: AC
  Administered 2013-12-13: 1 mg via INTRAMUSCULAR
  Filled 2013-12-13: qty 1

## 2013-12-13 NOTE — ED Notes (Signed)
Patient's family reports two episodes of rigid, tonic/clonic activity, states different from usual tantrum.  Activity not observed by this RN or other staff. No hx of epilepsy, contusion noted to high forehead. MD notified.

## 2013-12-13 NOTE — ED Notes (Addendum)
Pt with advanced alzheimer's, c/o pain of undetermined origin.  Daughter states xrays negative for hips and thighs.  Pt non-verbal, moans with movement. Was ambulatory until yesterday.

## 2013-12-13 NOTE — ED Provider Notes (Addendum)
CSN: [id number]     Arrival date & time 12/13/13  1217 History   First MD Initiated Contact with Patient 12/13/13 1230     Chief Complaint  Patient presents with  . Hip Pain     (Consider location/radiation/quality/duration/timing/severity/associated sxs/prior Treatment) Patient is a 79 y.o. male presenting with hip pain. The history is provided by the patient and a relative. The history is limited by the condition of the patient.  Hip Pain  pt with hx advanced dementia, presents from ecf w family who indicates they want him to be given pain medication and get back in hospice care. They have noticed in past week a 'turn for worse', whereby patient with periods where he appears increasingly agitated and uncomfortable or in pain. They deny any recent trauma or fall. In past day, increased difficulty feeding, and pt spitting out his normal pain medication. They state they had been with hospice care for several months, but were 'dropped' a month ago due to patients weight and overall condition seeming to stabilize. They indicate yesterday they tried to re-establish with hospice so that their father could be kept comfortable, and receive other pain medication, but were unable to do so.  No fevers. No trauma/fall. No other recent change in medication.  Level 5 caveat - pt w severe dementia, unresponsive.     Past Medical History  Diagnosis Date  . GLUCOSE INTOLERANCE 11/02/2008  . HYPERLIPIDEMIA 02/05/2007  . Other specified forms of hearing loss 06/07/2009  . ALLERGIC RHINITIS 06/07/2009  . CHOLELITHIASIS 06/07/2009  . BACK PAIN 02/05/2007  . LEG CRAMPS, NOCTURNAL 06/16/2008  . DIZZINESS 11/09/2008  . FATIGUE 11/02/2008  . TREMOR 06/16/2008  . DYSPNEA 11/02/2008  . Hepatomegaly 11/09/2008  . Impaired glucose tolerance 04/14/2011  . Dementia 04/14/2011  . Depression 04/14/2011  . Alzheimer disease    Past Surgical History  Procedure Laterality Date  . Left and right hand surgury     Family History   Problem Relation Age of Onset  . Heart disease Mother   . Alcohol abuse Father     Alcoholism/Addiction  . Cancer Father     Throat cancer (was a smoker)   History  Substance Use Topics  . Smoking status: Never Smoker   . Smokeless tobacco: Not on file  . Alcohol Use: No    Review of Systems  Unable to perform ROS: Dementia  level 5 caveat     Allergies  Review of patient's allergies indicates no known allergies.  Home Medications   Prior to Admission medications   Medication Sig Start Date End Date Taking? Authorizing Provider  acetaminophen (TYLENOL) 325 MG tablet Take 650 mg by mouth every 6 (six) hours as needed for mild pain.    Yes Historical Provider, MD  docusate sodium (COLACE) 100 MG capsule Take 100 mg by mouth at bedtime.   Yes Historical Provider, MD  ENSURE (ENSURE) Take 1 Can by mouth daily with breakfast.   Yes Historical Provider, MD  HYDROcodone-acetaminophen (NORCO/VICODIN) 5-325 MG per tablet Take 1 tablet by mouth every 4 (four) hours as needed for moderate pain.   Yes Historical Provider, MD  ketotifen (ZADITOR) 0.025 % ophthalmic solution Place 1 drop into both eyes 2 (two) times daily.   Yes Historical Provider, MD  primidone (MYSOLINE) 50 MG tablet Take 50 mg by mouth at bedtime.    Yes Historical Provider, MD  sertraline (ZOLOFT) 25 MG tablet Take 25 mg by mouth daily with breakfast.   Yes Historical  Provider, MD   BP 110/50  Pulse 107  Resp 20  Ht 5\' 9"  (1.753 m)  Wt 118 lb (53.524 kg)  BMI 17.42 kg/m2  SpO2 98% Physical Exam  Nursing note and vitals reviewed. Constitutional: He appears well-developed and well-nourished. No distress.  HENT:  Head: Atraumatic.  Mouth/Throat: Oropharynx is clear and moist.  Eyes: Conjunctivae are normal. Pupils are equal, round, and reactive to light. No scleral icterus.  Neck: Neck supple. No tracheal deviation present.  No stiffness or rigidity.  Cardiovascular: Normal rate, regular rhythm, normal heart  sounds and intact distal pulses.   Pulmonary/Chest: Effort normal and breath sounds normal. No accessory muscle usage. No respiratory distress.  Abdominal: Soft. Bowel sounds are normal. He exhibits no distension. There is no tenderness.  Musculoskeletal: Normal range of motion. He exhibits no edema.  CTLS spine, non tender, aligned, no step off.Peri [place]. Good rom bil ext without pain or focal bony tenderness. No sts.   Neurological: He is alert.  Pt moving bil ext w good strength.   Skin: Skin is warm and dry. No rash noted. He is not diaphoretic.  Psychiatric:  Calm at rest. w any prodding of pt, moving extremities, or changing his position her becomes very agitated.     ED Course  Procedures (including critical care time) Labs Review      MDM   Reviewed nursing notes and prior charts for additional history.   Discussed plan for labs and ua (check for metabolic disturbance, electrolyte/NA abn, infection/uti).  Family refuses.   Patients family, 2 daughters/POA indicate pts wishes were clear prior to advance dementia, and his/their wishes remain that no testing be done in the ER, or any treatment provided, other than pain medication and/or medication for anxiety/agitation. They indicate they do not want any blood, urine, or xray testing, and also request no iv fluids, no antibiotics.   They indicate their reason for ED visit was wanting to re-establish w hospice care, and to get pain shot/medication for pt, as he spits out his hydrocodone.  Hospice called - the on call nurse returned call - she was familiar w case, states their nurse saw yesterday, and ran by their on call physician who declined to admit to hospice then.  She indicates the best she is able to do is make some additional calls and that they will reassess for admission to hospice care at pts current facility tomorrow.   Recheck pt, calm and alert.  Discuss plan w hospice care to reassess tomorrow. In interim, will provide rx for  liquid pain medication.   Pt/fam refusing medical evaluation and/or treatment, however do requests more med for anxiety/agitation and pain.  Ativan im, morphine im.   Also discussed pt with Dr Gibson RampFeldman, hospice md on call - he indicates [facility], and try to re-establish care of pt along w facility md Dr Redmond Schoolripp to best manage pts symptoms.  Dr Redmond Schoolripp also called to discuss pt and family wishes/concerns.  He states we can give rx for oral morphine for facility, and he will f/u closely to coordinate pain control and med management, and f/u w hospice.  Dr Redmond Schoolripp also spoke w family while in ED.  Recheck, pt less agitated, comfortable appearing. Family now states they have contacted [facility], who has short term, residential bed available at their facility and they request pt go to that facility.    I spoke w Kend[facility] - she  indicates she or facility MD will re-contact us in the next hour to verify/confirm placement to their facility this evening.  Kendal HymenBonnie called back to say they have reviewed pts case and spoken with [facility], and that pt will not qualify for admission to their facility.  Will have f/u with Dr Redmond Schoolripp and [facility] tomorrow morning, and give rx as discussed with Dr Redmond Schoolripp and [facility].  Family also requests rx ativan oral solution.      Suzi RootsKevin E Khiyan Crace, MD 12/13/13 [phone]

## 2013-12-13 NOTE — Discharge Instructions (Signed)
It was our pleasure to provide your ER care today - we hope that you feel better.  We have discussed your case with Dr Redmond Schoolripp - he indicates for us to give you prescription for oral morphine, and that he will follow up tomorrow to ensure your fathers comfort, and can assist coordinating hospice services.  Dr Redmond Schoolripp also indicates you can reach him via his answering service, [phone], if any issues or concerns arise.   We also discussed your case with Hospice who indicates they will also re-evaluate you tomorrow, and work with Dr Redmond Schoolripp to best manage your medication.  You may given ativan oral solution, as need, as prescribed for anxiety/agitation.   Return to ER if worse, or should your decide you want to pursue medical evaluation and treatment, other concern.     Dementia Dementia is a general term for problems with brain function. A person with dementia has memory loss and a hard time with at least one other brain function such as thinking, speaking, or problem solving. Dementia can affect social functioning, how you do your job, your mood, or your personality. The changes may be hidden for a long time. The earliest forms of this disease are usually not detected by family or friends. Dementia can be:  Irreversible.  Potentially reversible.  Partially reversible.  Progressive. This means it can get worse over time. CAUSES  Irreversible dementia causes may include:  Degeneration of brain cells (Alzheimer disease or Lewy body dementia).  Multiple small strokes (vascular dementia).  Infection (chronic meningitis or Creutzfeldt-Jakob disease).  Frontotemporal dementia. This affects younger people, age 77 to 2070, compared to those who have Alzheimer disease.  Dementia associated with other disorders like Parkinson disease, Huntington disease, or HIV-associated dementia. Potentially or partially reversible dementia causes may include:  Medicines.  Metabolic causes such as excessive  alcohol intake, vitamin B12 deficiency, or thyroid disease.  Masses or pressure in the brain such as a tumor, blood clot, or hydrocephalus. SIGNS AND SYMPTOMS  Symptoms are often hard to detect. Family members or coworkers may not notice them early in the disease process. Different people with dementia may have different symptoms. Symptoms can include:  A hard time with memory, especially recent memory. Long-term memory may not be impaired.  Asking the same question multiple times or forgetting something someone just said.  A hard time speaking your thoughts or finding certain words.  A hard time solving problems or performing familiar tasks (such as how to use a telephone).  Sudden changes in mood.  Changes in personality, especially increasing moodiness or mistrust.  Depression.  A hard time understanding complex ideas that were never a problem in the past. DIAGNOSIS  There are no specific tests for dementia.   Your health care provider may recommend a thorough evaluation. This is because some forms of dementia can be reversible. The evaluation will likely include a physical exam and getting a detailed history from you and a family member. The history often gives the best clues and suggestions for a diagnosis.  Memory testing may be done. A detailed brain function evaluation called neuropsychologic testing may be helpful.  Lab tests and brain imaging (such as a CT scan or MRI scan) are sometimes important.  Sometimes observation and re-evaluation over time is very helpful. TREATMENT  Treatment depends on the cause.   If the problem is a vitamin deficiency, it may be helped or cured with supplements.  For dementias such as Alzheimer disease, medicines are available to  stabilize or slow the course of the disease. There are no cures for this type of dementia.  Your health care provider can help direct you to groups, organizations, and other health care providers to help with  decisions in the care of you or your loved one. HOME CARE INSTRUCTIONS The care of individuals with dementia is varied and dependent upon the progression of the dementia. The following suggestions are intended for the person living with, or caring for, the person with dementia.  Create a safe environment.  Remove the locks on bathroom doors to prevent the person from accidentally locking himself or herself in.  Use childproof latches on kitchen cabinets and any place where cleaning supplies, chemicals, or alcohol are kept.  Use childproof covers in unused electrical outlets.  Install childproof devices to keep doors and windows secured.  Remove stove knobs or install safety knobs and an automatic shut-off on the stove.  Lower the temperature on water heaters.  Label medicines and keep them locked up.  Secure knives, lighters, matches, power tools, and guns, and keep these items out of reach.  Keep the house free from clutter. Remove rugs or anything that might contribute to a fall.  Remove objects that might break and hurt the person.  Make sure lighting is good, both inside and outside.  Install grab rails as needed.  Use a monitoring device to alert you to falls or other needs for help.  Reduce confusion.  Keep familiar objects and people around.  Use night lights or dim lights at night.  Label items or areas.  Use reminders, notes, or directions for daily activities or tasks.  Keep a simple, consistent routine for waking, meals, bathing, dressing, and bedtime.  Create a calm, quiet environment.  Place large clocks and calendars prominently.  Display emergency numbers and home address near all telephones.  Use cues to establish different times of the day. An example is to open curtains to let the natural light in during the day.   Use effective communication.  Choose simple words and short sentences.  Use a gentle, calm tone of voice.  Be careful not to  interrupt.  If the person is struggling to find a word or communicate a thought, try to provide the word or thought.  Ask one question at a time. Allow the person ample time to answer questions. Repeat the question again if the person does not respond.  Reduce nighttime restlessness.  Provide a comfortable bed.  Have a consistent nighttime routine.  Ensure a regular walking or physical activity schedule. Involve the person in daily activities as much as possible.  Limit napping during the day.  Limit caffeine.  Attend social events that stimulate rather than overwhelm the senses.  Encourage good nutrition and hydration.  Reduce distractions during meal times and snacks.  Avoid foods that are too hot or too cold.  Monitor chewing and swallowing ability.  Continue with routine vision, hearing, dental, and medical screenings.  Give medicines only as directed by the health care provider.  Monitor driving abilities. Do not allow the person to drive when safe driving is no longer possible.  Register with an identification program which could provide location assistance in the event of a missing person situation. SEEK MEDICAL CARE IF:   New behavioral problems start such as moodiness, aggressiveness, or seeing things that are not there (hallucinations).  Any new problem with brain function happens. This includes problems with balance, speech, or falling a lot.  Problems  with swallowing develop.  Any symptoms of other illness happen. Small changes or worsening in any aspect of brain function can be a sign that the illness is getting worse. It can also be a sign of another medical illness such as infection. Seeing a health care provider right away is important. SEEK IMMEDIATE MEDICAL CARE IF:   A fever develops.  New or worsened confusion develops.  New or worsened sleepiness develops.  Staying awake becomes hard to do. Document Released: 08/08/2000 Document Revised:  06/29/2013 Document Reviewed: 07/10/2010 [facility] Patient Information 2015 [employer] [place]. This information is not intended to replace advice given to you by your health care provider. Make sure you discuss any questions you have with your health care provider.

## 2013-12-13 NOTE — ED Notes (Signed)
Bed: WA06 Expected date: 12/13/13 Expected time: 12:13 PM Means of arrival: Ambulance Comments: Hip pain, x ray yesterday, dementia

## 2013-12-13 NOTE — ED Notes (Signed)
Called into room patient having seizure. Patient noted to have violent shaking. Arms, legs and head shaking. RN notified.

## 2013-12-27 DEATH — deceased

## 2014-08-10 IMAGING — CT CT HEAD W/O CM
2 series · 16 of 30 positions shown, 19 images · non-contrast
Comparison: MRI and head CT, 03/07/2012

CLINICAL DATA: Patient fell and has laceration to the back of the
head.

EXAM:
CT HEAD WITHOUT CONTRAST
TECHNIQUE: Contiguous axial images were obtained from the base of the skull
through the vertex without intravenous contrast.

[Series 2: head w/o · axial · non-contrast · 0.48mm/px · z∈[-94,+31]mm · 9 of 33 slices shown, 12 images]
[im 4/33  brain]
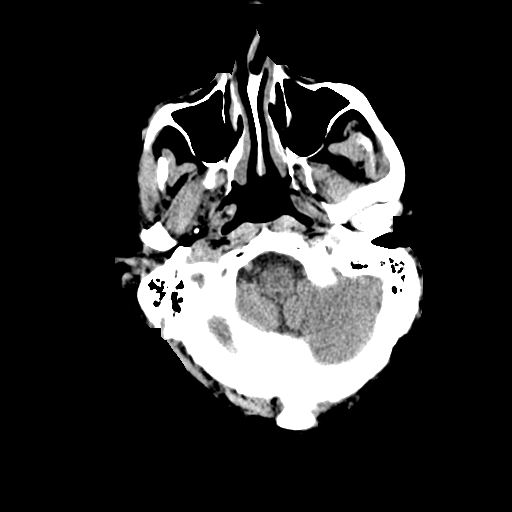
[im 4/33  bone]
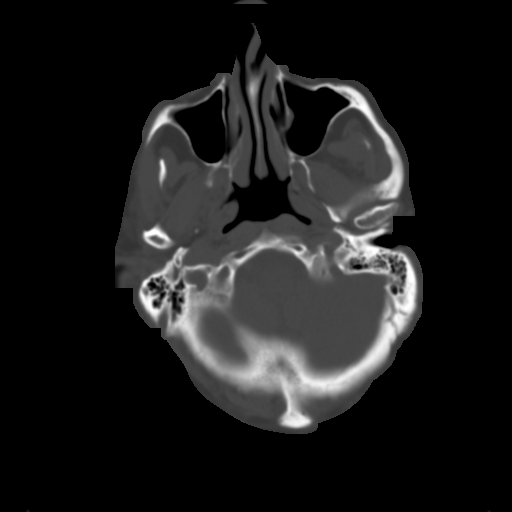
[im 7/33  brain]
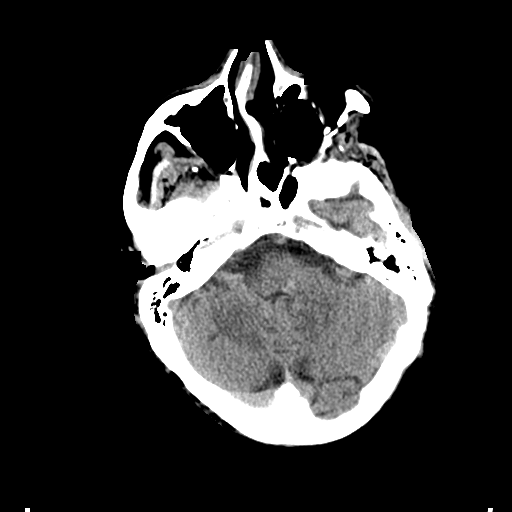
[im 10/33  brain]
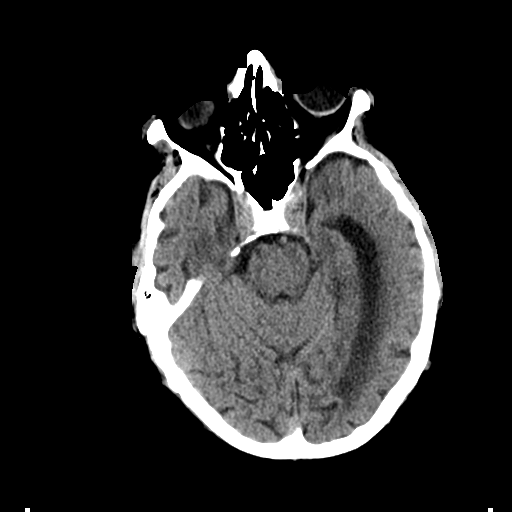
[im 13/33  brain]
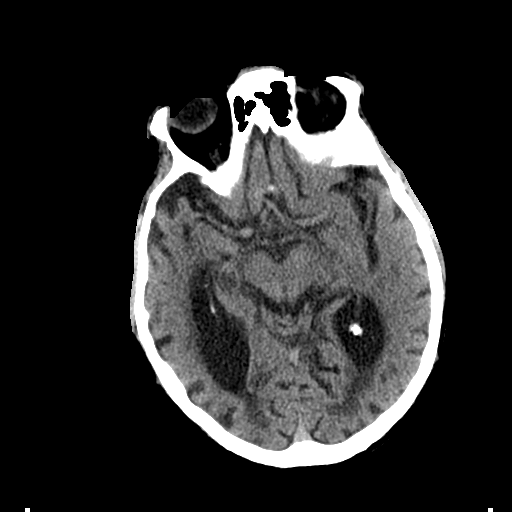
[im 17/33  brain]
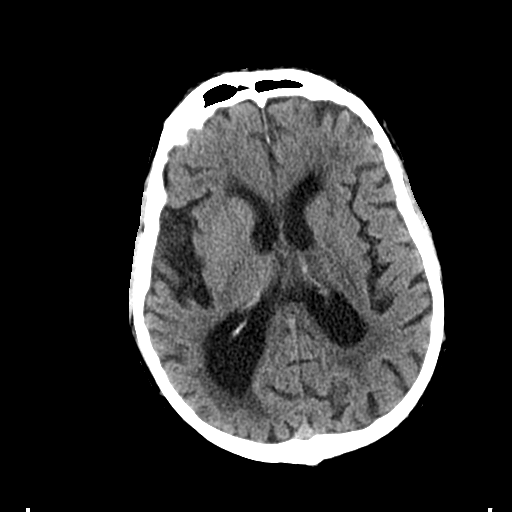
[im 17/33  bone]
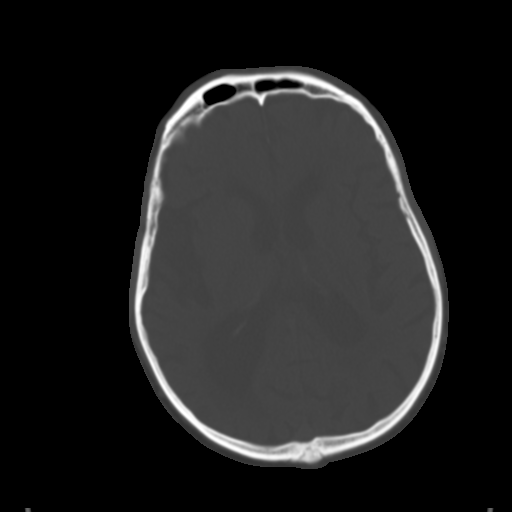
[im 20/33  brain]
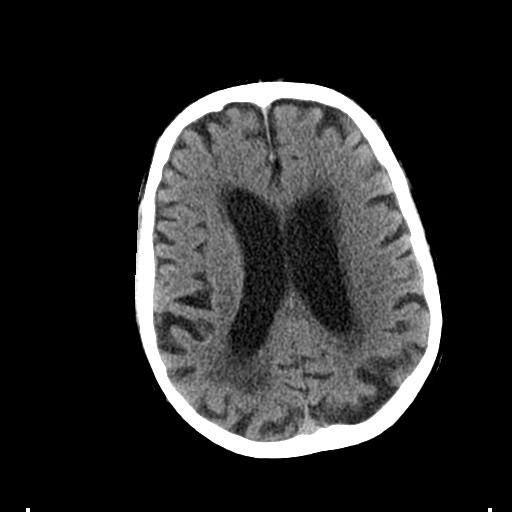
[im 23/33  brain]
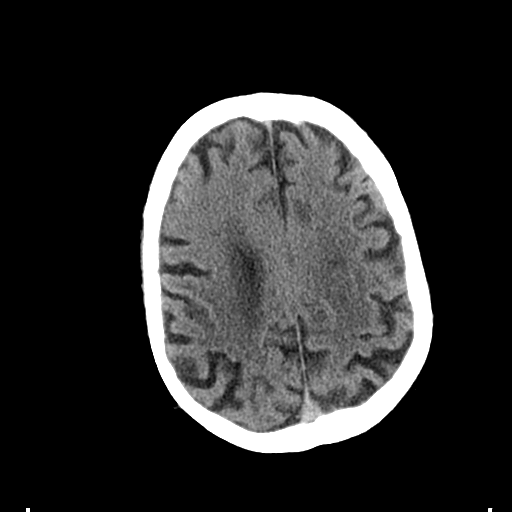
[im 26/33  brain]
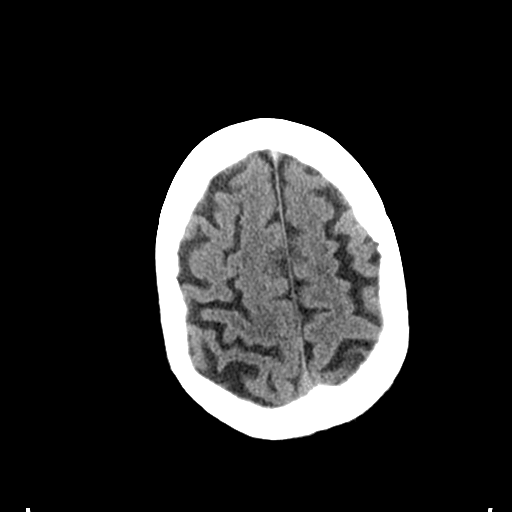
[im 29/33  brain]
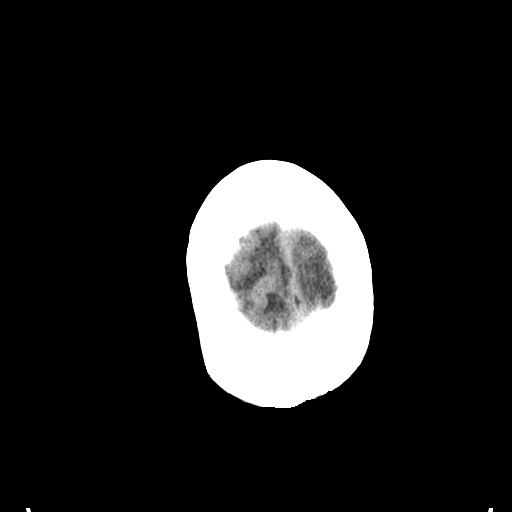
[im 29/33  bone]
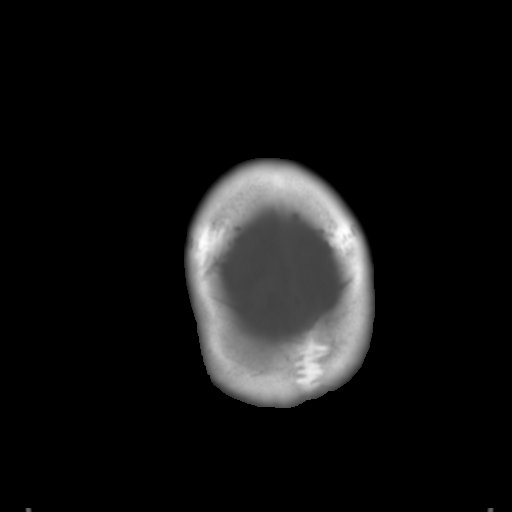

[Series 3: bone windows · axial · 0.48mm/px · z∈[-91,+17]mm · 7 of 55 slices shown]
[im 7/55  bone]
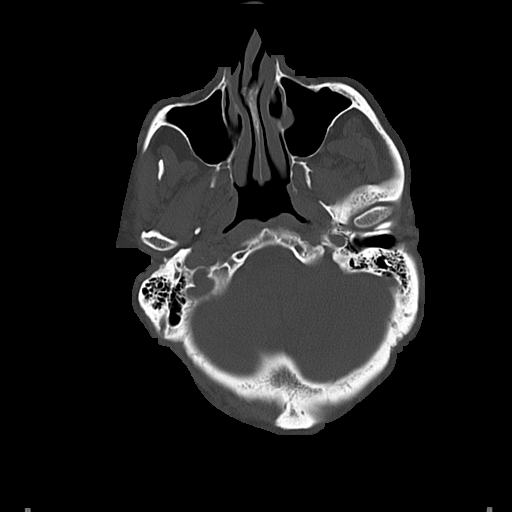
[im 13/55  bone]
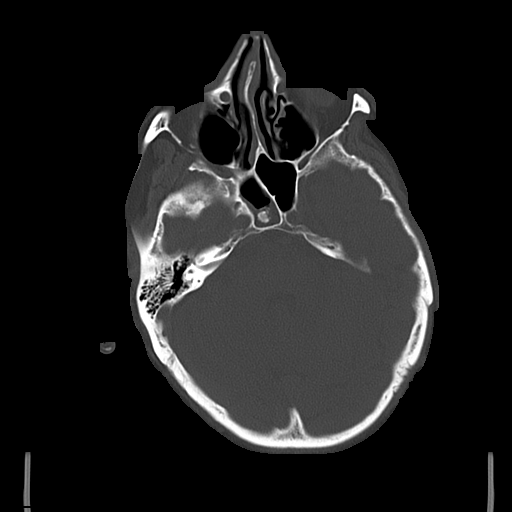
[im 19/55  bone]
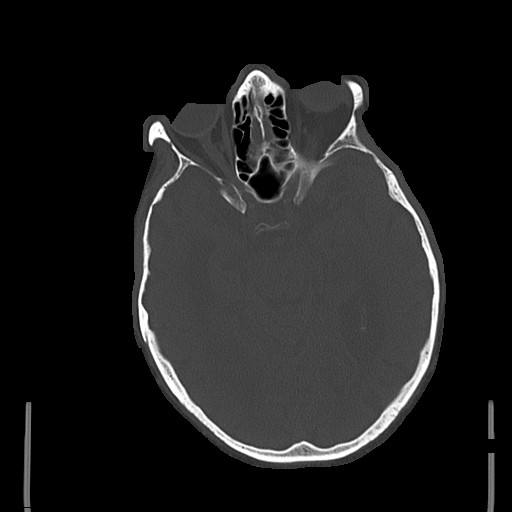
[im 25/55  bone]
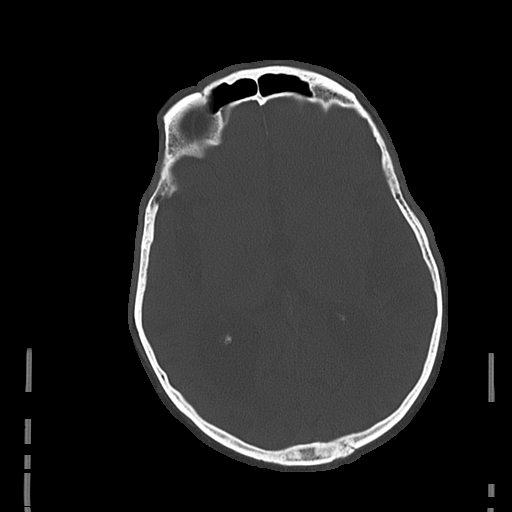
[im 31/55  bone]
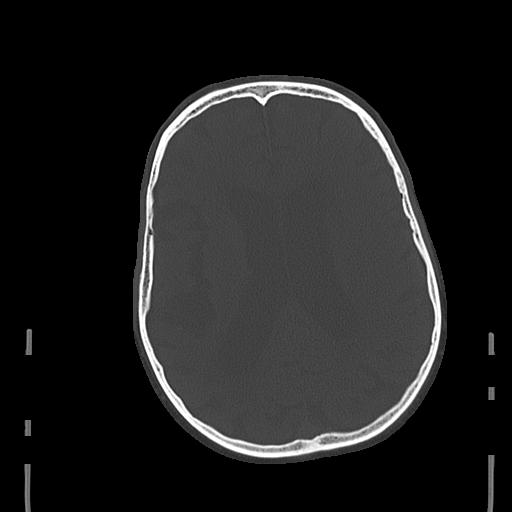
[im 37/55  bone]
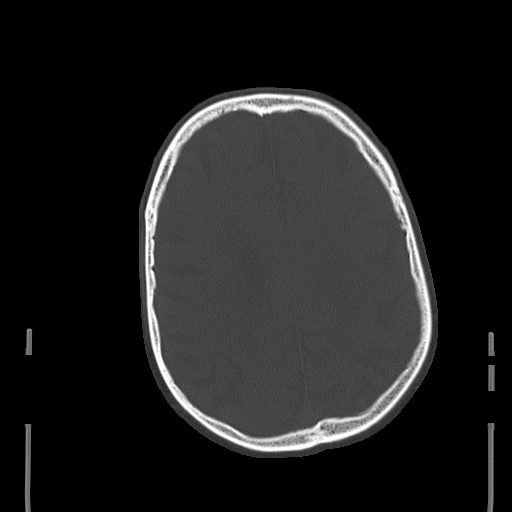
[im 43/55  bone]
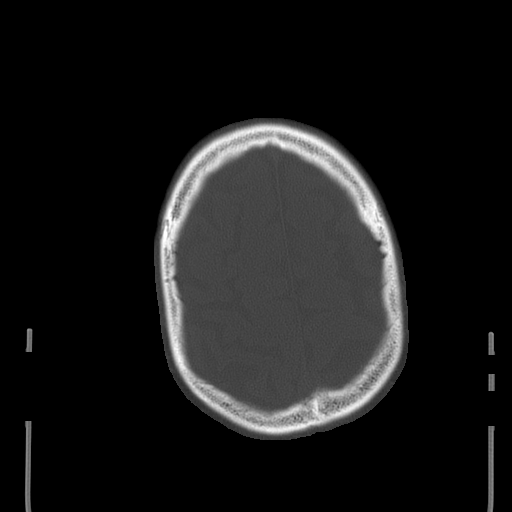

[16 of 30 positions shown; findings below may reference images not displayed]

FINDINGS: Ventricles are normal in configuration. There is ventricular and
sulcal enlargement reflecting moderate to advanced atrophy. This is
stable.

No parenchymal masses or mass effect. Patchy white matter
hypoattenuation is noted consistent with moderate chronic
microvascular ischemic change. No evidence of a recent transcortical
infarct.

No extra-axial masses or abnormal fluid collections.

No intracranial hemorrhage.

No skull fracture. Chronic mucosal thickening in the right sphenoid
sinus. Clear mastoid air cells.
IMPRESSION: 1. No acute intracranial abnormalities.
2. Atrophy and chronic microvascular ischemic change, stable from
the prior studies.

## 2014-08-23 ENCOUNTER — Other Ambulatory Visit: Payer: Self-pay
